# Patient Record
Sex: Female | Born: 1995 | Race: White | Hispanic: No | Marital: Single | Smoking: Never smoker
Health system: Southern US, Community
[De-identification: ages and names within clinical notes are randomized; demographics above are authoritative.]

## PROBLEM LIST (undated history)

## (undated) DIAGNOSIS — K859 Acute pancreatitis without necrosis or infection, unspecified: Secondary | ICD-10-CM

## (undated) DIAGNOSIS — N200 Calculus of kidney: Secondary | ICD-10-CM

## (undated) DIAGNOSIS — I951 Orthostatic hypotension: Secondary | ICD-10-CM

## (undated) DIAGNOSIS — N289 Disorder of kidney and ureter, unspecified: Secondary | ICD-10-CM

## (undated) HISTORY — PX: LAPAROSCOPIC GASTRIC SLEEVE RESECTION: SHX5895

## (undated) HISTORY — PX: HERNIA REPAIR: SHX51

## (undated) HISTORY — PX: OTHER SURGICAL HISTORY: SHX169

## (undated) HISTORY — PX: BREAST SURGERY: SHX581

---

## 2018-09-04 ENCOUNTER — Encounter (HOSPITAL_COMMUNITY): Payer: Self-pay | Admitting: *Deleted

## 2018-09-04 ENCOUNTER — Other Ambulatory Visit: Payer: Self-pay

## 2018-09-04 ENCOUNTER — Emergency Department (HOSPITAL_COMMUNITY): Payer: Self-pay

## 2018-09-04 ENCOUNTER — Emergency Department (HOSPITAL_COMMUNITY)
Admission: EM | Admit: 2018-09-04 | Discharge: 2018-09-04 | Disposition: A | Discharge status: Home / Self Care | Payer: Self-pay | Attending: Emergency Medicine | Admitting: Emergency Medicine

## 2018-09-04 DIAGNOSIS — Y929 Unspecified place or not applicable: Secondary | ICD-10-CM | POA: Insufficient documentation

## 2018-09-04 DIAGNOSIS — S39012A Strain of muscle, fascia and tendon of lower back, initial encounter: Secondary | ICD-10-CM | POA: Insufficient documentation

## 2018-09-04 DIAGNOSIS — Y9352 Activity, horseback riding: Secondary | ICD-10-CM | POA: Insufficient documentation

## 2018-09-04 DIAGNOSIS — W19XXXA Unspecified fall, initial encounter: Secondary | ICD-10-CM

## 2018-09-04 DIAGNOSIS — Y999 Unspecified external cause status: Secondary | ICD-10-CM | POA: Insufficient documentation

## 2018-09-04 DIAGNOSIS — S7001XA Contusion of right hip, initial encounter: Secondary | ICD-10-CM | POA: Insufficient documentation

## 2018-09-04 DIAGNOSIS — Z79899 Other long term (current) drug therapy: Secondary | ICD-10-CM | POA: Insufficient documentation

## 2018-09-04 HISTORY — DX: Orthostatic hypotension: I95.1

## 2018-09-04 HISTORY — DX: Disorder of kidney and ureter, unspecified: N28.9

## 2018-09-04 HISTORY — DX: Acute pancreatitis without necrosis or infection, unspecified: K85.90

## 2018-09-04 HISTORY — DX: Calculus of kidney: N20.0

## 2018-09-04 LAB — I-STAT BETA HCG BLOOD, ED (MC, WL, AP ONLY): I-stat hCG, quantitative: <5 m[IU]/mL (ref <5)

## 2018-09-04 MED ORDER — KETOROLAC TROMETHAMINE 15 MG/ML IJ SOLN
15.0000 mg | Freq: Once | INTRAMUSCULAR | Status: AC
  Administered 2018-09-04: 15 mg via INTRAVENOUS
  Filled 2018-09-04: qty 1

## 2018-09-04 MED ORDER — NAPROXEN 375 MG PO TABS: 375.0000 mg | ORAL_TABLET | Freq: Two times a day (BID) | ORAL | 0 refills | Status: DC

## 2018-09-04 MED ORDER — HYDROCODONE-ACETAMINOPHEN 5-325 MG PO TABS: 1.0000 | ORAL_TABLET | Freq: Four times a day (QID) | ORAL | 0 refills | Status: DC | PRN

## 2018-09-04 MED ORDER — ONDANSETRON HCL 4 MG/2ML IJ SOLN
4.0000 mg | Freq: Once | INTRAMUSCULAR | Status: AC
  Administered 2018-09-04: 4 mg via INTRAVENOUS
  Filled 2018-09-04: qty 2

## 2018-09-04 MED ORDER — HYDROMORPHONE HCL 1 MG/ML IJ SOLN
1.0000 mg | Freq: Once | INTRAMUSCULAR | Status: AC
  Administered 2018-09-04: 1 mg via INTRAVENOUS
  Filled 2018-09-04: qty 1

## 2018-09-04 MED ORDER — PROMETHAZINE HCL 25 MG/ML IJ SOLN
25.0000 mg | Freq: Once | INTRAMUSCULAR | Status: AC
  Administered 2018-09-04: 25 mg via INTRAVENOUS
  Filled 2018-09-04: qty 1

## 2018-09-04 MED ORDER — ONDANSETRON 4 MG PO TBDP: 4.0000 mg | ORAL_TABLET | Freq: Three times a day (TID) | ORAL | 0 refills | Status: DC | PRN

## 2018-09-04 NOTE — ED Notes (Signed)
Pt able to stand without assistance.

## 2018-09-04 NOTE — ED Provider Notes (Signed)
[facility]-EMERGENCY DEPT Provider Note   CSN: [id number] Arrival date & time: 09/04/18  1751     History   Chief Complaint Chief Complaint  Patient presents with  . Fall    HPI Camaria Gerald is a 22 y.o. female.  HPI  22 year old female presents after being thrown off a horse.  She was not wearing a helmet.  She states the horse bucked and she fell but is not sure exactly how she fell.  She was able to get up and ambulate back to the house.  However after she rested she has been unable to get up and bear weight due to the pain.  Pain is mostly in her lumbar back and right hip/buttock.  Pain is severe.  She was given 200 mcg fentanyl by EMS.  Now she is feeling a little nauseated.  She denies LOC, headache, chest pain, shortness of breath or thoracic back pain.  No abdominal pain.  No weakness or numbness in her extremities.  Past Medical History:  Diagnosis Date  . Hypotension, postural   . Kidney stones   . Pancreatitis   . Renal disorder    acute kidney failure    There are no active problems to display for this patient.   Past Surgical History:  Procedure Laterality Date  . BREAST SURGERY     Breast lift  . HERNIA REPAIR     hiatal hernia  . Tummy Tuck       OB History   None      Home Medications    Prior to Admission medications   Medication Sig Start Date End Date Taking? Authorizing Provider  esomeprazole (NEXIUM) 40 MG capsule Take 40 mg by mouth 2 (two) times daily before a meal.   Yes [provider]  medroxyPROGESTERone (DEPO-PROVERA) 150 MG/ML injection Inject 150 mg into the muscle every 3 (three) months.   Yes [provider]  temazepam (RESTORIL) 7.5 MG capsule Take 7.5 mg by mouth at bedtime as needed for sleep.   Yes [provider]  HYDROcodone-acetaminophen (NORCO) 5-325 MG tablet Take 1 tablet by mouth every 6 (six) hours as needed for severe pain. 09/04/18   Pricilla Loveless, MD  naproxen  (NAPROSYN) 375 MG tablet Take 1 tablet (375 mg total) by mouth 2 (two) times daily. 09/04/18   Pricilla Loveless, MD  ondansetron (ZOFRAN ODT) 4 MG disintegrating tablet Take 1 tablet (4 mg total) by mouth every 8 (eight) hours as needed for nausea or vomiting. 09/04/18   Pricilla Loveless, MD    Family History No family history on file.  Social History Social History   Tobacco Use  . Smoking status: Never Smoker  . Smokeless tobacco: Never Used  Substance Use Topics  . Alcohol use: Yes    Comment: socially  . Drug use: Never     Allergies   Ciprofloxacin   Review of Systems Review of Systems  Cardiovascular: Negative for chest pain.  Gastrointestinal: Negative for abdominal pain.  Musculoskeletal: Positive for back pain.  Neurological: Negative for weakness, numbness and headaches.  All other systems reviewed and are negative.    Physical Exam Updated Vital Signs BP 96/66   Pulse 72   Temp 98.7 F (37.1 C) (Oral)   Resp 16   Ht 5\' 5"  (1.651 m)   Wt 65.8 kg   SpO2 100%   BMI 24.13 kg/m   Physical Exam  Constitutional: She is oriented to person, place, and time. She  appears well-developed and well-nourished.  HENT:  Head: Normocephalic and atraumatic.  Right Ear: External ear normal.  Left Ear: External ear normal.  Nose: Nose normal.  Eyes: Right eye exhibits no discharge. Left eye exhibits no discharge.  Cardiovascular: Normal rate, regular rhythm and normal heart sounds.  Pulses:      Dorsalis pedis pulses are 2+ on the right side, and 2+ on the left side.  Pulmonary/Chest: Effort normal and breath sounds normal.  Abdominal: Soft. She exhibits no distension. There is no tenderness.  Musculoskeletal:       Right hip: She exhibits tenderness. She exhibits normal range of motion.       Thoracic back: She exhibits no tenderness.       Lumbar back: She exhibits tenderness.       Back:       Legs: Neurological: She is alert and oriented to person, place, and  time.  Skin: Skin is warm and dry.  Nursing note and vitals reviewed.    ED Treatments / Results  Labs (all labs ordered are listed, but only abnormal results are displayed) Labs Reviewed  I-STAT BETA HCG BLOOD, ED ([facility], [facility], [facility] ONLY)    EKG None  Radiology Dg Lumbar Spine Complete  Result Date: 09/04/2018 CLINICAL DATA:  Fall with back pain EXAM: LUMBAR SPINE - COMPLETE 4+ VIEW COMPARISON:  None. FINDINGS: There is no evidence of lumbar spine fracture. Alignment is normal. Intervertebral disc spaces are maintained. IMPRESSION: Negative. Electronically Signed   By: Jasmine Pang M.D.   On: 09/04/2018 19:30   Dg Hip Unilat  With Pelvis 2-3 Views Right  Result Date: 09/04/2018 CLINICAL DATA:  Thrown from horse with pain in the right hip pelvis and back EXAM: DG HIP (WITH OR WITHOUT PELVIS) 2-3V RIGHT COMPARISON:  None. FINDINGS: There is no evidence of hip fracture or dislocation. There is no evidence of arthropathy or other focal bone abnormality. IMPRESSION: Negative. Electronically Signed   By: Jasmine Pang M.D.   On: 09/04/2018 19:29    Procedures Procedures (including critical care time)  Medications Ordered in ED Medications  HYDROmorphone (DILAUDID) injection 1 mg (1 mg Intravenous Given 09/04/18 1825)  ondansetron (ZOFRAN) injection 4 mg (4 mg Intravenous Given 09/04/18 1824)  ketorolac (TORADOL) 15 MG/ML injection 15 mg (15 mg Intravenous Given 09/04/18 2023)  promethazine (PHENERGAN) injection 25 mg (25 mg Intravenous Given 09/04/18 2030)     Initial Impression / Assessment and Plan / ED Course  I have reviewed the triage vital signs and the nursing notes.  Pertinent labs & imaging results that were available during my care of the patient were reviewed by me and considered in my medical decision making (see chart for details).     No fractures identified on x-ray imaging from fall.  Neurovascularly intact. She is able to ambulate and bear weight.  While it is painful, she  can bear weight and so I highly doubt occult fracture.  Her pain will be treated and she will be given follow-up with her PCP.  However at this point there does not appear to be an acute indication for admission or further imaging.  Discharged home with return precautions.  Final Clinical Impressions(s) / ED Diagnoses   Final diagnoses:  Fall, initial encounter  Contusion of right hip, initial encounter  Strain of lumbar region, initial encounter    ED Discharge Orders         Ordered    HYDROcodone-acetaminophen (NORCO) 5-325 MG tablet  Every 6 hours PRN,   Status:  Discontinued     09/04/18 2042    naproxen (NAPROSYN) 375 MG tablet  2 times daily,   Status:  Discontinued     09/04/18 2042    naproxen (NAPROSYN) 375 MG tablet  2 times daily     09/04/18 2044    HYDROcodone-acetaminophen (NORCO) 5-325 MG tablet  Every 6 hours PRN     09/04/18 2044    ondansetron (ZOFRAN ODT) 4 MG disintegrating tablet  Every 8 hours PRN     09/04/18 2118           Pricilla Loveless, MD 09/04/18 2225

## 2018-09-04 NOTE — ED Triage Notes (Signed)
Per EMS, pt from home, reports pt was thrown off a horse landing on her back. Did not hit her head.  Pt is A&Ox 4.  She received of Fentanyl en route.

## 2018-09-04 NOTE — ED Notes (Signed)
Abrasions and bruising noted to her bila posterior FA, R medial thigh and LLE.  She denies any abd pain or neck pain.  She reports pressure pain in her sacral area and R buttocks.

## 2018-09-04 NOTE — Discharge Instructions (Addendum)
If you are unable to bear weight on your leg or walk, or if you develop weakness, numbness, or any other new/concerning symptoms and return to the ER for evaluation. Do not take ibuprofen, Aleve, naproxen, Motrin, Advil, or any other NSAIDs while you are on the naproxen.

## 2018-09-04 NOTE — ED Notes (Signed)
Bed: VZ56 Expected date:  Expected time:  Means of arrival:  Comments: EMS thrown off a horse

## 2018-10-21 IMAGING — CR DG LUMBAR SPINE COMPLETE 4+V
5 series · 5 of 5 positions shown · non-contrast
Comparison: None.

CLINICAL DATA: Fall with back pain

EXAM:
LUMBAR SPINE - COMPLETE 4+ VIEW

[t lumbar spine ap]
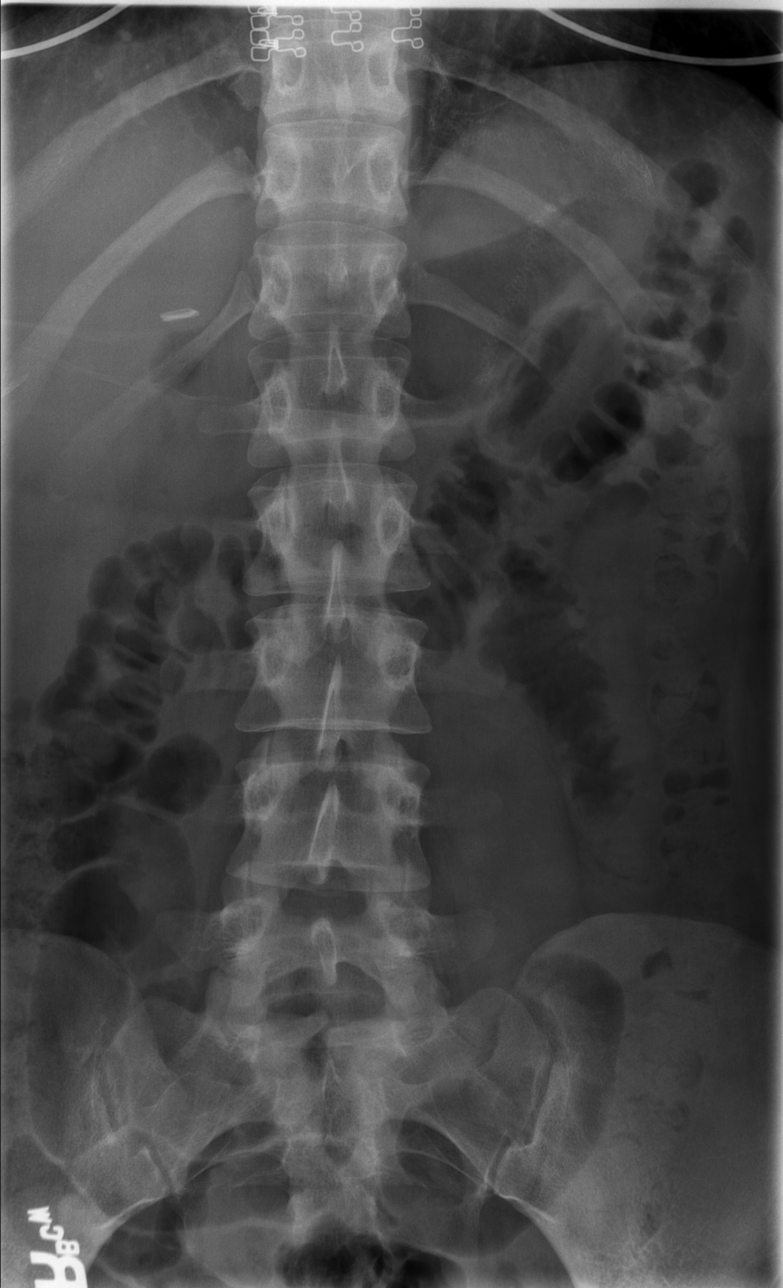

[t lumbar spine obl (1 of 2)]
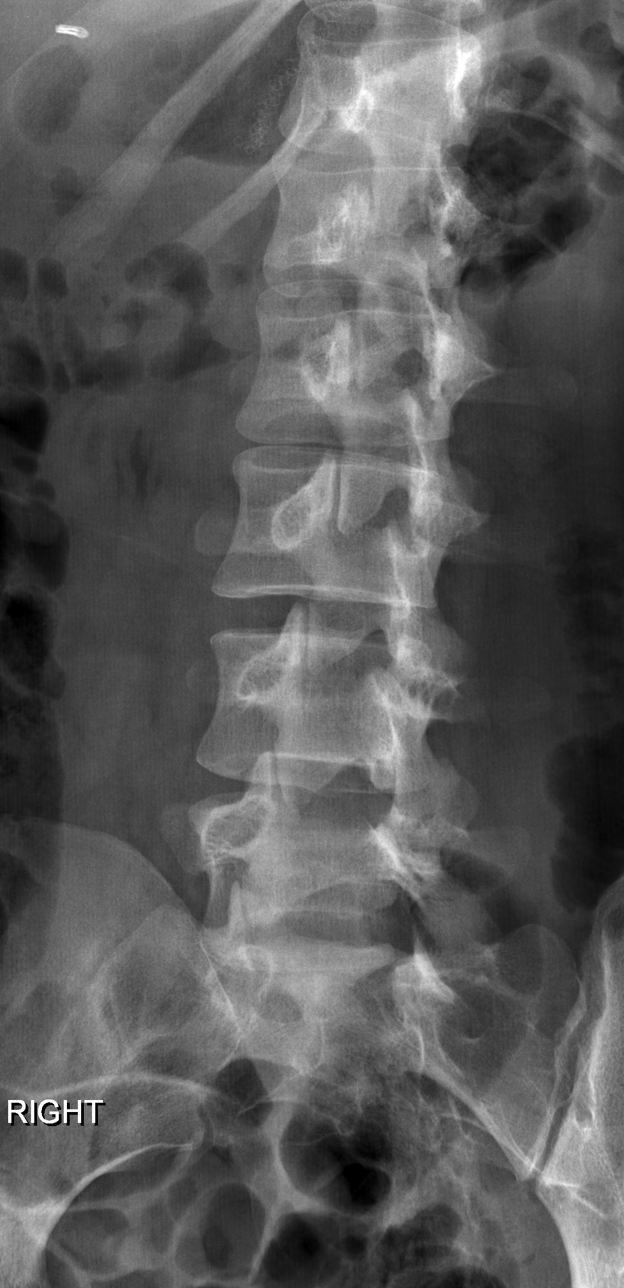

[t lumbar spine obl (2 of 2)]
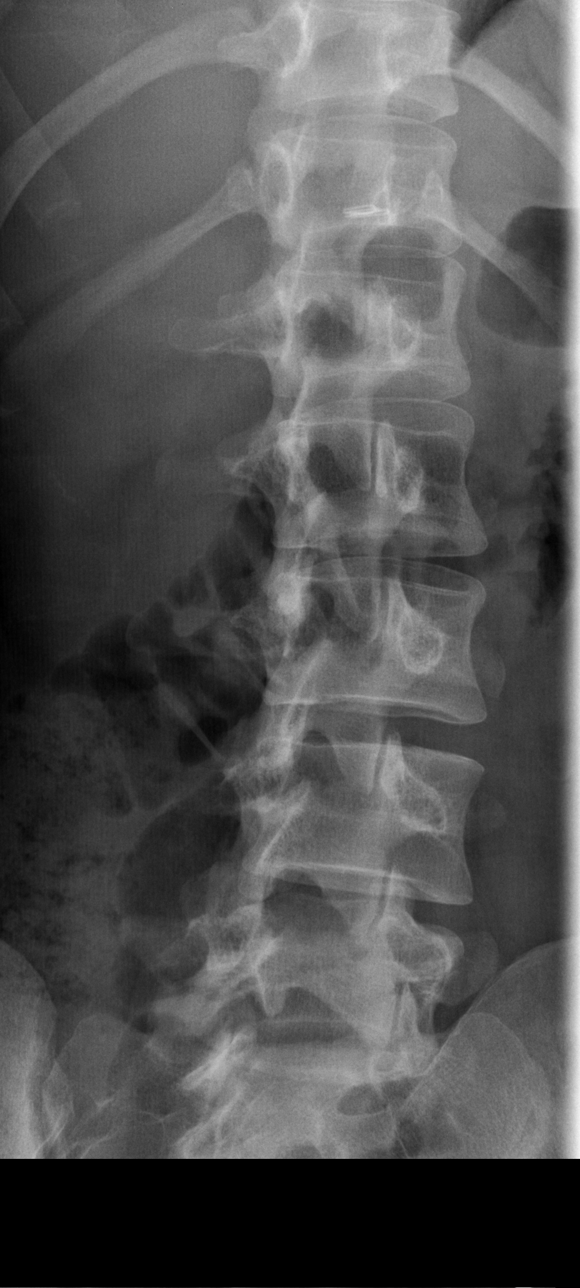

[t lumbar spine lat]
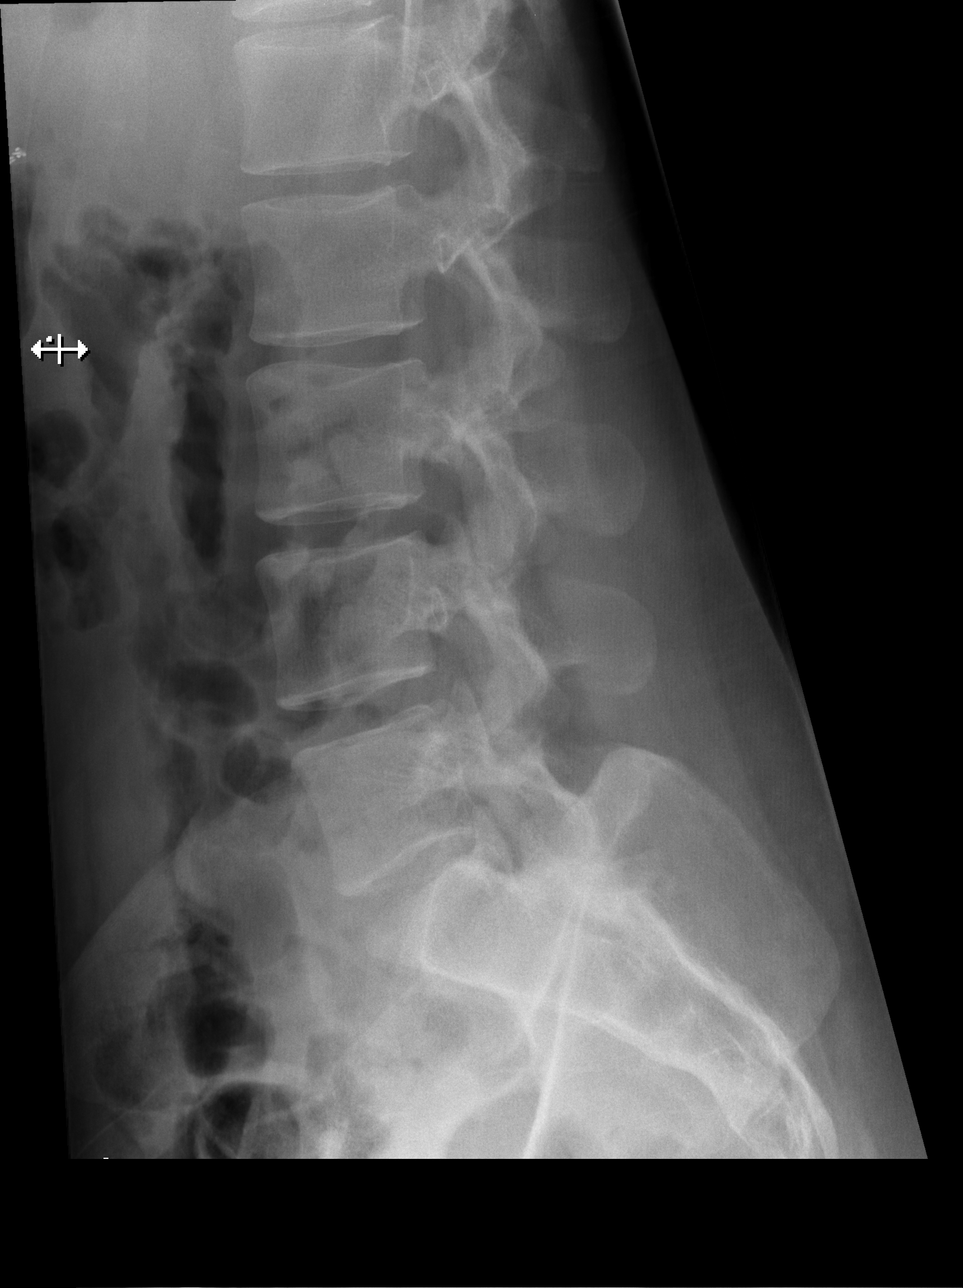

[t lumbar l-5 s-1 spot]
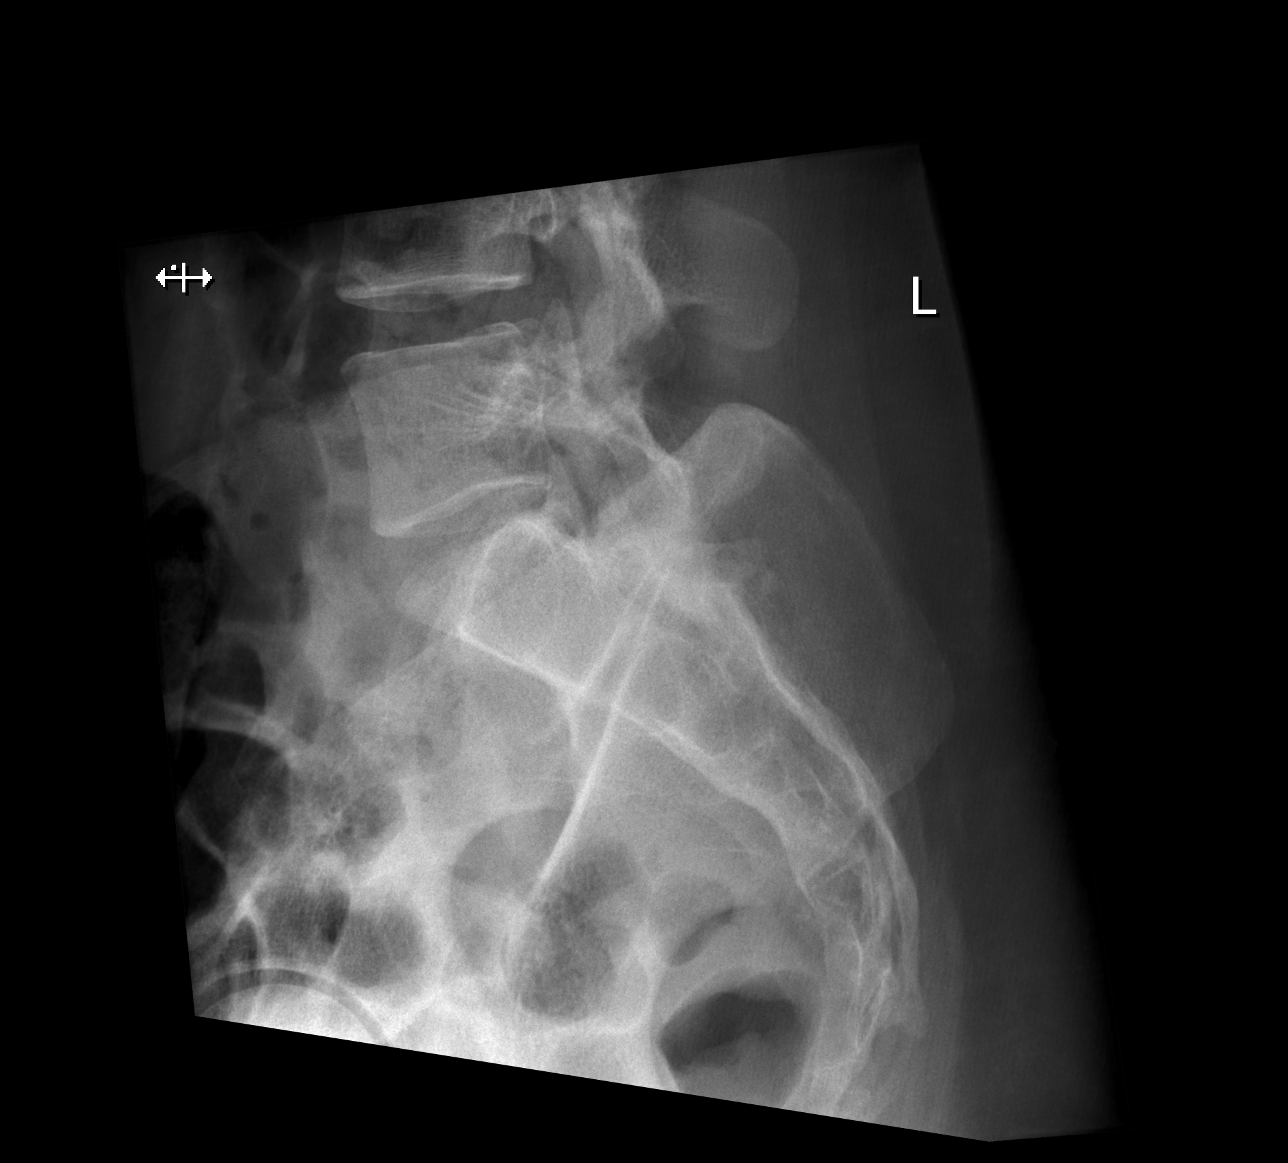

[5 of 5 positions shown; findings below may reference images not displayed]

FINDINGS: There is no evidence of lumbar spine fracture. Alignment is normal.
Intervertebral disc spaces are maintained.
IMPRESSION: Negative.

## 2021-04-09 DIAGNOSIS — F3181 Bipolar II disorder: Secondary | ICD-10-CM | POA: Diagnosis not present

## 2021-05-08 DIAGNOSIS — F4312 Post-traumatic stress disorder, chronic: Secondary | ICD-10-CM | POA: Diagnosis not present

## 2021-06-14 DIAGNOSIS — F3181 Bipolar II disorder: Secondary | ICD-10-CM | POA: Diagnosis not present

## 2021-08-24 DIAGNOSIS — F3181 Bipolar II disorder: Secondary | ICD-10-CM | POA: Diagnosis not present

## 2021-09-07 DIAGNOSIS — F3181 Bipolar II disorder: Secondary | ICD-10-CM | POA: Diagnosis not present

## 2021-09-11 DIAGNOSIS — F4312 Post-traumatic stress disorder, chronic: Secondary | ICD-10-CM | POA: Diagnosis not present

## 2021-09-11 DIAGNOSIS — F3181 Bipolar II disorder: Secondary | ICD-10-CM | POA: Diagnosis not present

## 2021-10-11 DIAGNOSIS — F4312 Post-traumatic stress disorder, chronic: Secondary | ICD-10-CM | POA: Diagnosis not present

## 2021-12-21 DIAGNOSIS — U071 COVID-19: Secondary | ICD-10-CM | POA: Diagnosis not present

## 2021-12-21 DIAGNOSIS — R051 Acute cough: Secondary | ICD-10-CM | POA: Diagnosis not present

## 2022-01-09 DIAGNOSIS — F4312 Post-traumatic stress disorder, chronic: Secondary | ICD-10-CM | POA: Diagnosis not present

## 2022-04-09 DIAGNOSIS — F4312 Post-traumatic stress disorder, chronic: Secondary | ICD-10-CM | POA: Diagnosis not present

## 2022-07-08 DIAGNOSIS — S161XXA Strain of muscle, fascia and tendon at neck level, initial encounter: Secondary | ICD-10-CM | POA: Diagnosis not present

## 2022-07-08 DIAGNOSIS — S39012A Strain of muscle, fascia and tendon of lower back, initial encounter: Secondary | ICD-10-CM | POA: Diagnosis not present

## 2022-07-08 DIAGNOSIS — F4312 Post-traumatic stress disorder, chronic: Secondary | ICD-10-CM | POA: Diagnosis not present

## 2022-08-07 DIAGNOSIS — M79602 Pain in left arm: Secondary | ICD-10-CM | POA: Diagnosis not present

## 2022-08-07 DIAGNOSIS — F32A Depression, unspecified: Secondary | ICD-10-CM | POA: Diagnosis not present

## 2022-08-07 DIAGNOSIS — M542 Cervicalgia: Secondary | ICD-10-CM | POA: Diagnosis not present

## 2022-08-07 DIAGNOSIS — H6092 Unspecified otitis externa, left ear: Secondary | ICD-10-CM | POA: Diagnosis not present

## 2022-08-07 DIAGNOSIS — F419 Anxiety disorder, unspecified: Secondary | ICD-10-CM | POA: Diagnosis not present

## 2022-08-07 DIAGNOSIS — M25512 Pain in left shoulder: Secondary | ICD-10-CM | POA: Diagnosis not present

## 2022-08-07 DIAGNOSIS — G5692 Unspecified mononeuropathy of left upper limb: Secondary | ICD-10-CM | POA: Diagnosis not present

## 2022-08-07 DIAGNOSIS — R519 Headache, unspecified: Secondary | ICD-10-CM | POA: Diagnosis not present

## 2022-08-07 DIAGNOSIS — H9202 Otalgia, left ear: Secondary | ICD-10-CM | POA: Diagnosis not present

## 2022-08-07 DIAGNOSIS — Z79899 Other long term (current) drug therapy: Secondary | ICD-10-CM | POA: Diagnosis not present

## 2022-08-09 DIAGNOSIS — M47892 Other spondylosis, cervical region: Secondary | ICD-10-CM | POA: Diagnosis not present

## 2022-08-09 DIAGNOSIS — M5412 Radiculopathy, cervical region: Secondary | ICD-10-CM | POA: Diagnosis not present

## 2022-08-09 DIAGNOSIS — M542 Cervicalgia: Secondary | ICD-10-CM | POA: Diagnosis not present

## 2022-08-12 DIAGNOSIS — M5412 Radiculopathy, cervical region: Secondary | ICD-10-CM | POA: Diagnosis not present

## 2022-08-12 DIAGNOSIS — M47892 Other spondylosis, cervical region: Secondary | ICD-10-CM | POA: Diagnosis not present

## 2022-08-13 DIAGNOSIS — Z6829 Body mass index (BMI) 29.0-29.9, adult: Secondary | ICD-10-CM | POA: Diagnosis not present

## 2022-08-13 DIAGNOSIS — K449 Diaphragmatic hernia without obstruction or gangrene: Secondary | ICD-10-CM | POA: Diagnosis not present

## 2022-09-12 DIAGNOSIS — M542 Cervicalgia: Secondary | ICD-10-CM | POA: Diagnosis not present

## 2022-09-12 DIAGNOSIS — M47892 Other spondylosis, cervical region: Secondary | ICD-10-CM | POA: Diagnosis not present

## 2022-09-12 DIAGNOSIS — Q046 Congenital cerebral cysts: Secondary | ICD-10-CM | POA: Diagnosis not present

## 2022-09-12 DIAGNOSIS — M5412 Radiculopathy, cervical region: Secondary | ICD-10-CM | POA: Diagnosis not present

## 2022-10-02 DIAGNOSIS — K449 Diaphragmatic hernia without obstruction or gangrene: Secondary | ICD-10-CM | POA: Diagnosis not present

## 2022-10-02 DIAGNOSIS — K219 Gastro-esophageal reflux disease without esophagitis: Secondary | ICD-10-CM | POA: Diagnosis not present

## 2022-10-02 DIAGNOSIS — R109 Unspecified abdominal pain: Secondary | ICD-10-CM | POA: Diagnosis not present

## 2022-10-08 DIAGNOSIS — F4312 Post-traumatic stress disorder, chronic: Secondary | ICD-10-CM | POA: Diagnosis not present

## 2022-10-09 DIAGNOSIS — N281 Cyst of kidney, acquired: Secondary | ICD-10-CM | POA: Diagnosis not present

## 2022-10-09 DIAGNOSIS — K449 Diaphragmatic hernia without obstruction or gangrene: Secondary | ICD-10-CM | POA: Diagnosis not present

## 2022-10-09 DIAGNOSIS — R112 Nausea with vomiting, unspecified: Secondary | ICD-10-CM | POA: Diagnosis not present

## 2022-10-15 DIAGNOSIS — Z6829 Body mass index (BMI) 29.0-29.9, adult: Secondary | ICD-10-CM | POA: Diagnosis not present

## 2022-10-15 DIAGNOSIS — Z9884 Bariatric surgery status: Secondary | ICD-10-CM | POA: Diagnosis not present

## 2022-10-15 DIAGNOSIS — K449 Diaphragmatic hernia without obstruction or gangrene: Secondary | ICD-10-CM | POA: Diagnosis not present

## 2022-10-15 DIAGNOSIS — Z48815 Encounter for surgical aftercare following surgery on the digestive system: Secondary | ICD-10-CM | POA: Diagnosis not present

## 2022-10-29 DIAGNOSIS — Q046 Congenital cerebral cysts: Secondary | ICD-10-CM | POA: Diagnosis not present

## 2022-10-29 DIAGNOSIS — M47892 Other spondylosis, cervical region: Secondary | ICD-10-CM | POA: Diagnosis not present

## 2022-10-29 DIAGNOSIS — M5412 Radiculopathy, cervical region: Secondary | ICD-10-CM | POA: Diagnosis not present

## 2022-10-29 DIAGNOSIS — M542 Cervicalgia: Secondary | ICD-10-CM | POA: Diagnosis not present

## 2022-11-11 DIAGNOSIS — R112 Nausea with vomiting, unspecified: Secondary | ICD-10-CM | POA: Diagnosis not present

## 2022-11-11 DIAGNOSIS — Z87442 Personal history of urinary calculi: Secondary | ICD-10-CM | POA: Diagnosis not present

## 2022-11-11 DIAGNOSIS — F319 Bipolar disorder, unspecified: Secondary | ICD-10-CM | POA: Diagnosis not present

## 2022-11-11 DIAGNOSIS — K219 Gastro-esophageal reflux disease without esophagitis: Secondary | ICD-10-CM | POA: Diagnosis not present

## 2022-11-11 DIAGNOSIS — K660 Peritoneal adhesions (postprocedural) (postinfection): Secondary | ICD-10-CM | POA: Diagnosis not present

## 2022-11-11 DIAGNOSIS — F17290 Nicotine dependence, other tobacco product, uncomplicated: Secondary | ICD-10-CM | POA: Diagnosis not present

## 2022-11-11 DIAGNOSIS — Z903 Acquired absence of stomach [part of]: Secondary | ICD-10-CM | POA: Diagnosis not present

## 2022-11-11 DIAGNOSIS — K449 Diaphragmatic hernia without obstruction or gangrene: Secondary | ICD-10-CM | POA: Diagnosis not present

## 2022-11-11 DIAGNOSIS — Z9884 Bariatric surgery status: Secondary | ICD-10-CM | POA: Diagnosis not present

## 2022-11-11 DIAGNOSIS — F419 Anxiety disorder, unspecified: Secondary | ICD-10-CM | POA: Diagnosis not present

## 2022-11-11 DIAGNOSIS — Z9049 Acquired absence of other specified parts of digestive tract: Secondary | ICD-10-CM | POA: Diagnosis not present

## 2022-11-12 DIAGNOSIS — Z9049 Acquired absence of other specified parts of digestive tract: Secondary | ICD-10-CM | POA: Diagnosis not present

## 2022-11-12 DIAGNOSIS — K660 Peritoneal adhesions (postprocedural) (postinfection): Secondary | ICD-10-CM | POA: Diagnosis not present

## 2022-11-12 DIAGNOSIS — Z9884 Bariatric surgery status: Secondary | ICD-10-CM | POA: Diagnosis not present

## 2022-11-12 DIAGNOSIS — F17290 Nicotine dependence, other tobacco product, uncomplicated: Secondary | ICD-10-CM | POA: Diagnosis not present

## 2022-11-12 DIAGNOSIS — Z903 Acquired absence of stomach [part of]: Secondary | ICD-10-CM | POA: Diagnosis not present

## 2022-11-12 DIAGNOSIS — F319 Bipolar disorder, unspecified: Secondary | ICD-10-CM | POA: Diagnosis not present

## 2022-11-12 DIAGNOSIS — K219 Gastro-esophageal reflux disease without esophagitis: Secondary | ICD-10-CM | POA: Diagnosis not present

## 2022-11-12 DIAGNOSIS — R112 Nausea with vomiting, unspecified: Secondary | ICD-10-CM | POA: Diagnosis not present

## 2022-11-12 DIAGNOSIS — K449 Diaphragmatic hernia without obstruction or gangrene: Secondary | ICD-10-CM | POA: Diagnosis not present

## 2022-11-12 DIAGNOSIS — Z87442 Personal history of urinary calculi: Secondary | ICD-10-CM | POA: Diagnosis not present

## 2022-11-12 DIAGNOSIS — F419 Anxiety disorder, unspecified: Secondary | ICD-10-CM | POA: Diagnosis not present

## 2022-11-25 DIAGNOSIS — F4312 Post-traumatic stress disorder, chronic: Secondary | ICD-10-CM | POA: Diagnosis not present

## 2022-11-27 DIAGNOSIS — R231 Pallor: Secondary | ICD-10-CM | POA: Diagnosis not present

## 2022-11-27 DIAGNOSIS — R079 Chest pain, unspecified: Secondary | ICD-10-CM | POA: Diagnosis not present

## 2022-11-27 DIAGNOSIS — N83291 Other ovarian cyst, right side: Secondary | ICD-10-CM | POA: Diagnosis not present

## 2022-11-27 DIAGNOSIS — R001 Bradycardia, unspecified: Secondary | ICD-10-CM | POA: Diagnosis not present

## 2022-11-27 DIAGNOSIS — R002 Palpitations: Secondary | ICD-10-CM | POA: Diagnosis not present

## 2022-11-27 DIAGNOSIS — R0789 Other chest pain: Secondary | ICD-10-CM | POA: Diagnosis not present

## 2022-11-27 DIAGNOSIS — Z8719 Personal history of other diseases of the digestive system: Secondary | ICD-10-CM | POA: Diagnosis not present

## 2022-11-27 DIAGNOSIS — R112 Nausea with vomiting, unspecified: Secondary | ICD-10-CM | POA: Diagnosis not present

## 2022-11-27 DIAGNOSIS — R1013 Epigastric pain: Secondary | ICD-10-CM | POA: Diagnosis not present

## 2022-11-27 DIAGNOSIS — Z9889 Other specified postprocedural states: Secondary | ICD-10-CM | POA: Diagnosis not present

## 2022-11-27 DIAGNOSIS — R531 Weakness: Secondary | ICD-10-CM | POA: Diagnosis not present

## 2022-11-28 DIAGNOSIS — R531 Weakness: Secondary | ICD-10-CM | POA: Diagnosis not present

## 2022-11-28 DIAGNOSIS — R112 Nausea with vomiting, unspecified: Secondary | ICD-10-CM | POA: Diagnosis not present

## 2022-11-28 DIAGNOSIS — R0789 Other chest pain: Secondary | ICD-10-CM | POA: Diagnosis not present

## 2022-11-28 DIAGNOSIS — I498 Other specified cardiac arrhythmias: Secondary | ICD-10-CM | POA: Diagnosis not present

## 2022-11-28 DIAGNOSIS — R1013 Epigastric pain: Secondary | ICD-10-CM | POA: Diagnosis not present

## 2022-12-03 ENCOUNTER — Other Ambulatory Visit: Payer: Self-pay

## 2022-12-03 ENCOUNTER — Emergency Department: Payer: BC Managed Care – PPO

## 2022-12-03 ENCOUNTER — Emergency Department
Admission: EM | Admit: 2022-12-03 | Discharge: 2022-12-03 | Disposition: A | Discharge status: Home / Self Care | Payer: BC Managed Care – PPO | Attending: Emergency Medicine | Admitting: Emergency Medicine

## 2022-12-03 ENCOUNTER — Encounter: Payer: Self-pay | Admitting: Emergency Medicine

## 2022-12-03 DIAGNOSIS — R001 Bradycardia, unspecified: Secondary | ICD-10-CM | POA: Diagnosis not present

## 2022-12-03 DIAGNOSIS — R7989 Other specified abnormal findings of blood chemistry: Secondary | ICD-10-CM | POA: Diagnosis not present

## 2022-12-03 DIAGNOSIS — R1115 Cyclical vomiting syndrome unrelated to migraine: Secondary | ICD-10-CM

## 2022-12-03 DIAGNOSIS — G43A0 Cyclical vomiting, not intractable: Secondary | ICD-10-CM | POA: Insufficient documentation

## 2022-12-03 DIAGNOSIS — R112 Nausea with vomiting, unspecified: Secondary | ICD-10-CM | POA: Diagnosis not present

## 2022-12-03 DIAGNOSIS — N83201 Unspecified ovarian cyst, right side: Secondary | ICD-10-CM | POA: Diagnosis not present

## 2022-12-03 DIAGNOSIS — R109 Unspecified abdominal pain: Secondary | ICD-10-CM | POA: Diagnosis not present

## 2022-12-03 DIAGNOSIS — R509 Fever, unspecified: Secondary | ICD-10-CM | POA: Diagnosis not present

## 2022-12-03 LAB — COMPREHENSIVE METABOLIC PANEL
ALT: 8 U/L (ref 0–44)
AST: 11 U/L — ABNORMAL LOW (ref 15–41)
Albumin: 4.3 g/dL (ref 3.5–5.0)
Alkaline Phosphatase: 51 U/L (ref 38–126)
Anion gap: 12 (ref 5–15)
BUN: 6 mg/dL (ref 6–20)
CO2: 20 mmol/L — ABNORMAL LOW (ref 22–32)
Calcium: 9.2 mg/dL (ref 8.9–10.3)
Chloride: 103 mmol/L (ref 98–111)
Creatinine, Ser: 0.64 mg/dL (ref 0.44–1.00)
GFR, Estimated: >60 mL/min (ref >60)
Glucose, Bld: 84 mg/dL (ref 70–99)
Potassium: 3.7 mmol/L (ref 3.5–5.1)
Sodium: 135 mmol/L (ref 135–145)
Total Bilirubin: 1.6 mg/dL — ABNORMAL HIGH (ref 0.3–1.2)
Total Protein: 7.1 g/dL (ref 6.5–8.1)

## 2022-12-03 LAB — URINALYSIS, ROUTINE W REFLEX MICROSCOPIC
Bilirubin Urine: NEGATIVE
Glucose, UA: NEGATIVE mg/dL
Hgb urine dipstick: NEGATIVE
Ketones, ur: 80 mg/dL — AB
Leukocytes,Ua: NEGATIVE
Nitrite: NEGATIVE
Protein, ur: NEGATIVE mg/dL
Specific Gravity, Urine: 1.01 (ref 1.005–1.030)
pH: 6 (ref 5.0–8.0)

## 2022-12-03 LAB — D-DIMER, QUANTITATIVE: D-Dimer, Quant: 0.58 ug/mL-FEU — ABNORMAL HIGH (ref 0.00–0.50)

## 2022-12-03 LAB — CBC
HCT: 42.9 % (ref 36.0–46.0)
Hemoglobin: 14.7 g/dL (ref 12.0–15.0)
MCH: 31 pg (ref 26.0–34.0)
MCHC: 34.3 g/dL (ref 30.0–36.0)
MCV: 90.5 fL (ref 80.0–100.0)
Platelets: 300 10*3/uL (ref 150–400)
RBC: 4.74 MIL/uL (ref 3.87–5.11)
RDW: 12.4 % (ref 11.5–15.5)
WBC: 8 10*3/uL (ref 4.0–10.5)
nRBC: 0 % (ref 0.0–0.2)

## 2022-12-03 LAB — HCG, QUANTITATIVE, PREGNANCY: hCG, Beta Chain, Quant, S: <1 m[IU]/mL (ref <5)

## 2022-12-03 LAB — LIPASE, BLOOD: Lipase: 65 U/L — ABNORMAL HIGH (ref 11–51)

## 2022-12-03 MED ORDER — ONDANSETRON 4 MG PO TBDP: 4.0000 mg | ORAL_TABLET | Freq: Three times a day (TID) | ORAL | 0 refills | Status: DC | PRN

## 2022-12-03 MED ORDER — METOCLOPRAMIDE HCL 10 MG PO TABS: 10.0000 mg | ORAL_TABLET | Freq: Three times a day (TID) | ORAL | 0 refills | Status: DC | PRN

## 2022-12-03 MED ORDER — IOHEXOL 350 MG/ML SOLN
75.0000 mL | Freq: Once | INTRAVENOUS | Status: AC | PRN
  Administered 2022-12-03: 75 mL via INTRAVENOUS

## 2022-12-03 MED ORDER — ONDANSETRON HCL 4 MG/2ML IJ SOLN
4.0000 mg | Freq: Once | INTRAMUSCULAR | Status: AC
  Administered 2022-12-03: 4 mg via INTRAVENOUS
  Filled 2022-12-03: qty 2

## 2022-12-03 MED ORDER — DROPERIDOL 2.5 MG/ML IJ SOLN
2.5000 mg | Freq: Once | INTRAMUSCULAR | Status: AC
  Administered 2022-12-03: 2.5 mg via INTRAVENOUS
  Filled 2022-12-03: qty 2

## 2022-12-03 MED ORDER — LACTATED RINGERS IV BOLUS
1000.0000 mL | Freq: Once | INTRAVENOUS | Status: AC
  Administered 2022-12-03: 1000 mL via INTRAVENOUS

## 2022-12-03 MED ORDER — DROPERIDOL 2.5 MG/ML IJ SOLN: 2.5000 mg | Freq: Once | INTRAMUSCULAR | Status: DC

## 2022-12-03 NOTE — ED Provider Triage Note (Signed)
Emergency Medicine Provider Triage Evaluation Note  Victoria Andrews , a 26 y.o. female  was evaluated in triage.  Pt complains of fever, chills, nausea.  Hernia surgery 3 weeks ago.  Some pressure in her chest..  Review of Systems  Positive:  Negative:   Physical Exam  BP 106/70 (BP Location: Left Arm)   Pulse 65   Temp 98.6 F (37 C) (Oral)   Resp 18   SpO2 100%  Gen:   Awake, no distress   Resp:  Normal effort  MSK:   Moves extremities without difficulty  Other:    Medical Decision Making  Medically screening exam initiated at 9:39 AM.  Appropriate orders placed.  Victoria Andrews was informed that the remainder of the evaluation will be completed by another provider, this initial triage assessment does not replace that evaluation, and the importance of remaining in the ED until their evaluation is complete.     Faythe Ghee, PA-C 12/03/22 [phone]

## 2022-12-03 NOTE — ED Notes (Signed)
See triage note  Presents with cont's n/v  and fatigue  States she had hernia surgery recently and has had problems since  Has been seen at several places for same

## 2022-12-03 NOTE — ED Provider Notes (Signed)
[facility] Provider Note    Event Date/Time   First MD Initiated Contact with Patient 12/03/22 1004     (approximate)   History   Chief Complaint Abdominal Pain and Nausea   HPI  Victoria Andrews is a 26 y.o. female with past medical history of intractable nausea and vomiting, gastric sleeve, hiatal hernia status postrepair, and cholecystectomy who presents to the ED complaining of abdominal pain and nausea.  Patient reports that she underwent her second hiatal hernia repair at [facility] about 3 weeks ago.  She did not seem to have any problems initially, but states she has been dealing with increasing nausea and vomiting over the past week.  She states she has only been able to tolerate small sips of water at a time, has had very little solids for the past week.  She persistently feels nauseous with even moving or speaking, reports frequent dry heaving.  This been associated with pain in both sides of her upper abdomen.  She additionally reports sharp pain in the center of her chest and some mild difficulty breathing.  She has not had any fevers or cough, denies any pain or swelling in her legs.  She reports speaking with her surgeon at [facility], who told her that the symptoms seem unrelated to her surgery.  She was previously diagnosed with cannabinoid hyperemesis syndrome during [facility] 6 days ago, states she has not smoked marijuana since then.     Physical Exam   Triage Vital Signs: ED Triage Vitals [12/03/22 0939]  Enc Vitals Group     BP 106/70     Pulse Rate 65     Resp 18     Temp 98.6 F (37 C)     Temp Source Oral     SpO2 100 %     Weight 145 lb 1 oz (65.8 kg)     Height 5\' 5"  (1.651 m)     Head Circumference      Peak Flow      Pain Score 6     Pain Loc      Pain Edu?      Excl. in GC?     Most recent vital signs: Vitals:   12/03/22 0939  BP: 106/70  Pulse: 65  Resp: 18  Temp: 98.6 F (37 C)  SpO2: 100%     Constitutional: Alert and oriented. Eyes: Conjunctivae are normal. Head: Atraumatic. Nose: No congestion/rhinnorhea. Mouth/Throat: Mucous membranes are dry.  Cardiovascular: Normal rate, regular rhythm. Grossly normal heart sounds.  2+ radial pulses bilaterally. Respiratory: Normal respiratory effort.  No retractions. Lungs CTAB. Gastrointestinal: Soft and diffusely tender to palpation with no rebound or guarding. No distention. Musculoskeletal: No lower extremity tenderness nor edema.  Neurologic:  Normal speech and language. No gross focal neurologic deficits are appreciated.    ED Results / Procedures / Treatments   Labs (all labs ordered are listed, but only abnormal results are displayed) Labs Reviewed  LIPASE, BLOOD - Abnormal; Notable for the following components:      Result Value   Lipase 65 (*)    All other components within normal limits  COMPREHENSIVE METABOLIC PANEL - Abnormal; Notable for the following components:   CO2 20 (*)    AST 11 (*)    Total Bilirubin 1.6 (*)    All other components within normal limits  URINALYSIS, ROUTINE W REFLEX MICROSCOPIC - Abnormal; Notable for the following components:   Color, Urine  YELLOW (*)    APPearance CLEAR (*)    Ketones, ur 80 (*)    All other components within normal limits  D-DIMER, QUANTITATIVE - Abnormal; Notable for the following components:   D-Dimer, Quant 0.58 (*)    All other components within normal limits  CBC  HCG, QUANTITATIVE, PREGNANCY     EKG  ED ECG REPORT I, Chesley Noon, the attending physician, personally viewed and interpreted this ECG.   Date: 12/03/2022  EKG Time: 9:44  Rate: 54  Rhythm: sinus bradycardia  Axis: Normal  Intervals:none  ST&T Change: None  RADIOLOGY CTA chest reviewed and interpreted by me with no pulmonary embolism or infiltrate noted.  CT abdomen/pelvis reviewed and interpreted by me with no inflammatory changes, dilated bowel loops, or focal fluid  collections.  PROCEDURES:  Critical Care performed: No  Procedures   MEDICATIONS ORDERED IN ED: Medications  lactated ringers bolus 1,000 mL (1,000 mLs Intravenous New Bag/Given 12/03/22 1101)  ondansetron (ZOFRAN) injection 4 mg (4 mg Intravenous Given 12/03/22 1115)  droperidol (INAPSINE) 2.5 MG/ML injection 2.5 mg (2.5 mg Intravenous Given 12/03/22 1215)  iohexol (OMNIPAQUE) 350 MG/ML injection 75 mL (75 mLs Intravenous Contrast Given 12/03/22 1159)     IMPRESSION / MDM / ASSESSMENT AND PLAN / ED COURSE  I reviewed the triage vital signs and the nursing notes.                              26 y.o. female with past medical history of recurrent nausea and vomiting, hiatal hernia status postrepair, gastric sleeve procedure, and cholecystectomy who presents to the ED complaining of recurrent nausea and vomiting along with upper abdominal pain, chest pain, and shortness of breath for the past week.  Patient's presentation is most consistent with acute presentation with potential threat to life or bodily function.  Differential diagnosis includes, but is not limited to, intractable nausea and vomiting, dehydration, AKI, electrolyte abnormality, UTI, bowel obstruction, surgical complication, PE, ACS, pneumonia.  Patient well-appearing and in no acute distress, vital signs are unremarkable.  EKG shows no evidence of arrhythmia or ischemia, QTc within normal limits.  She does appear dehydrated and we will hydrate with IV fluids, anticipate treating with IV droperidol if pregnancy testing is negative.  Labs thus far are reassuring with no significant anemia, leukocytosis, electrolyte abnormality, or AKI.  She does have very mild elevation in her lipase, which could be from recurrent vomiting.  Mild elevation in bilirubin also noted however remainder of LFTs are unremarkable.  We will further assess with CT imaging of her abdomen/pelvis, D-dimer also noted to be elevated and we will check CTA of her  chest.  CTA chest is negative for acute process, CT of abdomen/pelvis is also unremarkable outside of ovarian cyst.  Do not suspect torsion at this time as she has no lower abdominal discomfort.  Patient does report feeling better following IV droperidol and IV fluid bolus.  She was offered admission for intractable nausea and vomiting, but is requesting be discharged home.  We will provide refill for Zofran and Reglan, patient reports she has an appointment scheduled to establish care with GI next month, was also provided with referral to establish care with PCP.  She was counseled to return to the ED for new or worsening symptoms, patient agrees with plan.      FINAL CLINICAL IMPRESSION(S) / ED DIAGNOSES   Final diagnoses:  Cyclic vomiting syndrome  Rx / DC Orders   ED Discharge Orders          Ordered    ondansetron (ZOFRAN-ODT) 4 MG disintegrating tablet  Every 8 hours PRN        12/03/22 1324    metoCLOPramide (REGLAN) 10 MG tablet  Every 8 hours PRN        12/03/22 1324             Note:  This document was prepared using Dragon voice recognition software and may include unintentional dictation errors.   Chesley Noon, MD 12/03/22 1327

## 2022-12-03 NOTE — ED Triage Notes (Signed)
Pt here with fever and fatigue. Pt was dx with CHS last week. Pt having nausea and fever at home. Pt also had a hernia repair surgery 3 weeks ago. Pt states talking and breathing makes her want to throw up. Pt stable in triage with family.

## 2022-12-17 DIAGNOSIS — K219 Gastro-esophageal reflux disease without esophagitis: Secondary | ICD-10-CM | POA: Diagnosis not present

## 2022-12-17 DIAGNOSIS — E876 Hypokalemia: Secondary | ICD-10-CM | POA: Diagnosis not present

## 2022-12-17 DIAGNOSIS — R112 Nausea with vomiting, unspecified: Secondary | ICD-10-CM | POA: Diagnosis not present

## 2022-12-17 DIAGNOSIS — Z6825 Body mass index (BMI) 25.0-25.9, adult: Secondary | ICD-10-CM | POA: Diagnosis not present

## 2022-12-17 DIAGNOSIS — F1290 Cannabis use, unspecified, uncomplicated: Secondary | ICD-10-CM | POA: Diagnosis not present

## 2022-12-17 DIAGNOSIS — F319 Bipolar disorder, unspecified: Secondary | ICD-10-CM | POA: Diagnosis not present

## 2022-12-17 DIAGNOSIS — Z79899 Other long term (current) drug therapy: Secondary | ICD-10-CM | POA: Diagnosis not present

## 2022-12-17 DIAGNOSIS — R109 Unspecified abdominal pain: Secondary | ICD-10-CM | POA: Diagnosis not present

## 2022-12-17 DIAGNOSIS — E860 Dehydration: Secondary | ICD-10-CM | POA: Diagnosis not present

## 2022-12-17 DIAGNOSIS — F419 Anxiety disorder, unspecified: Secondary | ICD-10-CM | POA: Diagnosis not present

## 2022-12-17 DIAGNOSIS — Z9049 Acquired absence of other specified parts of digestive tract: Secondary | ICD-10-CM | POA: Diagnosis not present

## 2022-12-17 DIAGNOSIS — E43 Unspecified severe protein-calorie malnutrition: Secondary | ICD-10-CM | POA: Diagnosis not present

## 2022-12-17 DIAGNOSIS — R079 Chest pain, unspecified: Secondary | ICD-10-CM | POA: Diagnosis not present

## 2022-12-17 DIAGNOSIS — Z903 Acquired absence of stomach [part of]: Secondary | ICD-10-CM | POA: Diagnosis not present

## 2022-12-18 DIAGNOSIS — R112 Nausea with vomiting, unspecified: Secondary | ICD-10-CM | POA: Diagnosis not present

## 2022-12-18 DIAGNOSIS — K219 Gastro-esophageal reflux disease without esophagitis: Secondary | ICD-10-CM | POA: Diagnosis not present

## 2022-12-18 DIAGNOSIS — E860 Dehydration: Secondary | ICD-10-CM | POA: Diagnosis not present

## 2022-12-18 DIAGNOSIS — Z9884 Bariatric surgery status: Secondary | ICD-10-CM | POA: Diagnosis not present

## 2022-12-18 DIAGNOSIS — F419 Anxiety disorder, unspecified: Secondary | ICD-10-CM | POA: Diagnosis not present

## 2022-12-18 DIAGNOSIS — R079 Chest pain, unspecified: Secondary | ICD-10-CM | POA: Diagnosis not present

## 2022-12-18 DIAGNOSIS — Z6825 Body mass index (BMI) 25.0-25.9, adult: Secondary | ICD-10-CM | POA: Diagnosis not present

## 2022-12-18 DIAGNOSIS — F1290 Cannabis use, unspecified, uncomplicated: Secondary | ICD-10-CM | POA: Diagnosis not present

## 2022-12-18 DIAGNOSIS — Z9049 Acquired absence of other specified parts of digestive tract: Secondary | ICD-10-CM | POA: Diagnosis not present

## 2022-12-18 DIAGNOSIS — E43 Unspecified severe protein-calorie malnutrition: Secondary | ICD-10-CM | POA: Diagnosis not present

## 2022-12-18 DIAGNOSIS — N179 Acute kidney failure, unspecified: Secondary | ICD-10-CM | POA: Diagnosis not present

## 2022-12-18 DIAGNOSIS — R109 Unspecified abdominal pain: Secondary | ICD-10-CM | POA: Diagnosis not present

## 2022-12-18 DIAGNOSIS — Z79899 Other long term (current) drug therapy: Secondary | ICD-10-CM | POA: Diagnosis not present

## 2022-12-18 DIAGNOSIS — F319 Bipolar disorder, unspecified: Secondary | ICD-10-CM | POA: Diagnosis not present

## 2022-12-18 DIAGNOSIS — Z903 Acquired absence of stomach [part of]: Secondary | ICD-10-CM | POA: Diagnosis not present

## 2022-12-18 DIAGNOSIS — E876 Hypokalemia: Secondary | ICD-10-CM | POA: Diagnosis not present

## 2022-12-19 DIAGNOSIS — E43 Unspecified severe protein-calorie malnutrition: Secondary | ICD-10-CM | POA: Diagnosis not present

## 2022-12-19 DIAGNOSIS — Z903 Acquired absence of stomach [part of]: Secondary | ICD-10-CM | POA: Diagnosis not present

## 2022-12-19 DIAGNOSIS — Z9049 Acquired absence of other specified parts of digestive tract: Secondary | ICD-10-CM | POA: Diagnosis not present

## 2022-12-19 DIAGNOSIS — F419 Anxiety disorder, unspecified: Secondary | ICD-10-CM | POA: Diagnosis not present

## 2022-12-19 DIAGNOSIS — F1290 Cannabis use, unspecified, uncomplicated: Secondary | ICD-10-CM | POA: Diagnosis not present

## 2022-12-19 DIAGNOSIS — R001 Bradycardia, unspecified: Secondary | ICD-10-CM | POA: Diagnosis not present

## 2022-12-19 DIAGNOSIS — R109 Unspecified abdominal pain: Secondary | ICD-10-CM | POA: Diagnosis not present

## 2022-12-19 DIAGNOSIS — Z79899 Other long term (current) drug therapy: Secondary | ICD-10-CM | POA: Diagnosis not present

## 2022-12-19 DIAGNOSIS — E876 Hypokalemia: Secondary | ICD-10-CM | POA: Diagnosis not present

## 2022-12-19 DIAGNOSIS — R112 Nausea with vomiting, unspecified: Secondary | ICD-10-CM | POA: Diagnosis not present

## 2022-12-19 DIAGNOSIS — R079 Chest pain, unspecified: Secondary | ICD-10-CM | POA: Diagnosis not present

## 2022-12-19 DIAGNOSIS — K219 Gastro-esophageal reflux disease without esophagitis: Secondary | ICD-10-CM | POA: Diagnosis not present

## 2022-12-19 DIAGNOSIS — E860 Dehydration: Secondary | ICD-10-CM | POA: Diagnosis not present

## 2022-12-19 DIAGNOSIS — F4310 Post-traumatic stress disorder, unspecified: Secondary | ICD-10-CM | POA: Diagnosis not present

## 2022-12-19 DIAGNOSIS — F319 Bipolar disorder, unspecified: Secondary | ICD-10-CM | POA: Diagnosis not present

## 2022-12-19 DIAGNOSIS — Z6825 Body mass index (BMI) 25.0-25.9, adult: Secondary | ICD-10-CM | POA: Diagnosis not present

## 2022-12-20 DIAGNOSIS — K219 Gastro-esophageal reflux disease without esophagitis: Secondary | ICD-10-CM | POA: Diagnosis not present

## 2022-12-20 DIAGNOSIS — E860 Dehydration: Secondary | ICD-10-CM | POA: Diagnosis not present

## 2022-12-20 DIAGNOSIS — F319 Bipolar disorder, unspecified: Secondary | ICD-10-CM | POA: Diagnosis not present

## 2022-12-20 DIAGNOSIS — E43 Unspecified severe protein-calorie malnutrition: Secondary | ICD-10-CM | POA: Diagnosis not present

## 2022-12-20 DIAGNOSIS — Z903 Acquired absence of stomach [part of]: Secondary | ICD-10-CM | POA: Diagnosis not present

## 2022-12-20 DIAGNOSIS — R109 Unspecified abdominal pain: Secondary | ICD-10-CM | POA: Diagnosis not present

## 2022-12-20 DIAGNOSIS — R112 Nausea with vomiting, unspecified: Secondary | ICD-10-CM | POA: Diagnosis not present

## 2022-12-20 DIAGNOSIS — E876 Hypokalemia: Secondary | ICD-10-CM | POA: Diagnosis not present

## 2022-12-20 DIAGNOSIS — F1290 Cannabis use, unspecified, uncomplicated: Secondary | ICD-10-CM | POA: Diagnosis not present

## 2022-12-20 DIAGNOSIS — F419 Anxiety disorder, unspecified: Secondary | ICD-10-CM | POA: Diagnosis not present

## 2022-12-20 DIAGNOSIS — R079 Chest pain, unspecified: Secondary | ICD-10-CM | POA: Diagnosis not present

## 2022-12-20 DIAGNOSIS — Z6825 Body mass index (BMI) 25.0-25.9, adult: Secondary | ICD-10-CM | POA: Diagnosis not present

## 2022-12-20 DIAGNOSIS — Z79899 Other long term (current) drug therapy: Secondary | ICD-10-CM | POA: Diagnosis not present

## 2022-12-20 DIAGNOSIS — F4310 Post-traumatic stress disorder, unspecified: Secondary | ICD-10-CM | POA: Diagnosis not present

## 2022-12-20 DIAGNOSIS — Z9049 Acquired absence of other specified parts of digestive tract: Secondary | ICD-10-CM | POA: Diagnosis not present

## 2022-12-21 DIAGNOSIS — R079 Chest pain, unspecified: Secondary | ICD-10-CM | POA: Diagnosis not present

## 2022-12-21 DIAGNOSIS — R109 Unspecified abdominal pain: Secondary | ICD-10-CM | POA: Diagnosis not present

## 2022-12-21 DIAGNOSIS — Z6825 Body mass index (BMI) 25.0-25.9, adult: Secondary | ICD-10-CM | POA: Diagnosis not present

## 2022-12-21 DIAGNOSIS — F419 Anxiety disorder, unspecified: Secondary | ICD-10-CM | POA: Diagnosis not present

## 2022-12-21 DIAGNOSIS — E876 Hypokalemia: Secondary | ICD-10-CM | POA: Diagnosis not present

## 2022-12-21 DIAGNOSIS — E860 Dehydration: Secondary | ICD-10-CM | POA: Diagnosis not present

## 2022-12-21 DIAGNOSIS — E43 Unspecified severe protein-calorie malnutrition: Secondary | ICD-10-CM | POA: Diagnosis not present

## 2022-12-21 DIAGNOSIS — Z79899 Other long term (current) drug therapy: Secondary | ICD-10-CM | POA: Diagnosis not present

## 2022-12-21 DIAGNOSIS — F319 Bipolar disorder, unspecified: Secondary | ICD-10-CM | POA: Diagnosis not present

## 2022-12-21 DIAGNOSIS — Z9049 Acquired absence of other specified parts of digestive tract: Secondary | ICD-10-CM | POA: Diagnosis not present

## 2022-12-21 DIAGNOSIS — R112 Nausea with vomiting, unspecified: Secondary | ICD-10-CM | POA: Diagnosis not present

## 2022-12-21 DIAGNOSIS — K219 Gastro-esophageal reflux disease without esophagitis: Secondary | ICD-10-CM | POA: Diagnosis not present

## 2022-12-21 DIAGNOSIS — F1290 Cannabis use, unspecified, uncomplicated: Secondary | ICD-10-CM | POA: Diagnosis not present

## 2022-12-21 DIAGNOSIS — Z903 Acquired absence of stomach [part of]: Secondary | ICD-10-CM | POA: Diagnosis not present

## 2022-12-22 DIAGNOSIS — R079 Chest pain, unspecified: Secondary | ICD-10-CM | POA: Diagnosis not present

## 2022-12-22 DIAGNOSIS — E876 Hypokalemia: Secondary | ICD-10-CM | POA: Diagnosis not present

## 2022-12-22 DIAGNOSIS — K219 Gastro-esophageal reflux disease without esophagitis: Secondary | ICD-10-CM | POA: Diagnosis not present

## 2022-12-22 DIAGNOSIS — Z79899 Other long term (current) drug therapy: Secondary | ICD-10-CM | POA: Diagnosis not present

## 2022-12-22 DIAGNOSIS — Z9049 Acquired absence of other specified parts of digestive tract: Secondary | ICD-10-CM | POA: Diagnosis not present

## 2022-12-22 DIAGNOSIS — F419 Anxiety disorder, unspecified: Secondary | ICD-10-CM | POA: Diagnosis not present

## 2022-12-22 DIAGNOSIS — F319 Bipolar disorder, unspecified: Secondary | ICD-10-CM | POA: Diagnosis not present

## 2022-12-22 DIAGNOSIS — R112 Nausea with vomiting, unspecified: Secondary | ICD-10-CM | POA: Diagnosis not present

## 2022-12-22 DIAGNOSIS — E43 Unspecified severe protein-calorie malnutrition: Secondary | ICD-10-CM | POA: Diagnosis not present

## 2022-12-22 DIAGNOSIS — R109 Unspecified abdominal pain: Secondary | ICD-10-CM | POA: Diagnosis not present

## 2022-12-22 DIAGNOSIS — F1290 Cannabis use, unspecified, uncomplicated: Secondary | ICD-10-CM | POA: Diagnosis not present

## 2022-12-22 DIAGNOSIS — E860 Dehydration: Secondary | ICD-10-CM | POA: Diagnosis not present

## 2022-12-22 DIAGNOSIS — Z903 Acquired absence of stomach [part of]: Secondary | ICD-10-CM | POA: Diagnosis not present

## 2022-12-22 DIAGNOSIS — Z6825 Body mass index (BMI) 25.0-25.9, adult: Secondary | ICD-10-CM | POA: Diagnosis not present

## 2022-12-23 DIAGNOSIS — K449 Diaphragmatic hernia without obstruction or gangrene: Secondary | ICD-10-CM | POA: Diagnosis not present

## 2022-12-23 DIAGNOSIS — Z79899 Other long term (current) drug therapy: Secondary | ICD-10-CM | POA: Diagnosis not present

## 2022-12-23 DIAGNOSIS — R109 Unspecified abdominal pain: Secondary | ICD-10-CM | POA: Diagnosis not present

## 2022-12-23 DIAGNOSIS — Z903 Acquired absence of stomach [part of]: Secondary | ICD-10-CM | POA: Diagnosis not present

## 2022-12-23 DIAGNOSIS — R112 Nausea with vomiting, unspecified: Secondary | ICD-10-CM | POA: Diagnosis not present

## 2022-12-23 DIAGNOSIS — F1290 Cannabis use, unspecified, uncomplicated: Secondary | ICD-10-CM | POA: Diagnosis not present

## 2022-12-23 DIAGNOSIS — F419 Anxiety disorder, unspecified: Secondary | ICD-10-CM | POA: Diagnosis not present

## 2022-12-23 DIAGNOSIS — Z9049 Acquired absence of other specified parts of digestive tract: Secondary | ICD-10-CM | POA: Diagnosis not present

## 2022-12-23 DIAGNOSIS — K219 Gastro-esophageal reflux disease without esophagitis: Secondary | ICD-10-CM | POA: Diagnosis not present

## 2022-12-23 DIAGNOSIS — R079 Chest pain, unspecified: Secondary | ICD-10-CM | POA: Diagnosis not present

## 2022-12-23 DIAGNOSIS — R110 Nausea: Secondary | ICD-10-CM | POA: Diagnosis not present

## 2022-12-23 DIAGNOSIS — Z6825 Body mass index (BMI) 25.0-25.9, adult: Secondary | ICD-10-CM | POA: Diagnosis not present

## 2022-12-23 DIAGNOSIS — E876 Hypokalemia: Secondary | ICD-10-CM | POA: Diagnosis not present

## 2022-12-23 DIAGNOSIS — E860 Dehydration: Secondary | ICD-10-CM | POA: Diagnosis not present

## 2022-12-23 DIAGNOSIS — F319 Bipolar disorder, unspecified: Secondary | ICD-10-CM | POA: Diagnosis not present

## 2022-12-23 DIAGNOSIS — E43 Unspecified severe protein-calorie malnutrition: Secondary | ICD-10-CM | POA: Diagnosis not present

## 2022-12-24 DIAGNOSIS — F419 Anxiety disorder, unspecified: Secondary | ICD-10-CM | POA: Diagnosis not present

## 2022-12-24 DIAGNOSIS — R112 Nausea with vomiting, unspecified: Secondary | ICD-10-CM | POA: Diagnosis not present

## 2022-12-24 DIAGNOSIS — Z6825 Body mass index (BMI) 25.0-25.9, adult: Secondary | ICD-10-CM | POA: Diagnosis not present

## 2022-12-24 DIAGNOSIS — Z903 Acquired absence of stomach [part of]: Secondary | ICD-10-CM | POA: Diagnosis not present

## 2022-12-24 DIAGNOSIS — F1290 Cannabis use, unspecified, uncomplicated: Secondary | ICD-10-CM | POA: Diagnosis not present

## 2022-12-24 DIAGNOSIS — F319 Bipolar disorder, unspecified: Secondary | ICD-10-CM | POA: Diagnosis not present

## 2022-12-24 DIAGNOSIS — Z79899 Other long term (current) drug therapy: Secondary | ICD-10-CM | POA: Diagnosis not present

## 2022-12-24 DIAGNOSIS — K219 Gastro-esophageal reflux disease without esophagitis: Secondary | ICD-10-CM | POA: Diagnosis not present

## 2022-12-24 DIAGNOSIS — E43 Unspecified severe protein-calorie malnutrition: Secondary | ICD-10-CM | POA: Diagnosis not present

## 2022-12-24 DIAGNOSIS — R109 Unspecified abdominal pain: Secondary | ICD-10-CM | POA: Diagnosis not present

## 2022-12-24 DIAGNOSIS — Z9049 Acquired absence of other specified parts of digestive tract: Secondary | ICD-10-CM | POA: Diagnosis not present

## 2022-12-24 DIAGNOSIS — R079 Chest pain, unspecified: Secondary | ICD-10-CM | POA: Diagnosis not present

## 2022-12-24 DIAGNOSIS — E860 Dehydration: Secondary | ICD-10-CM | POA: Diagnosis not present

## 2022-12-24 DIAGNOSIS — E876 Hypokalemia: Secondary | ICD-10-CM | POA: Diagnosis not present

## 2022-12-25 DIAGNOSIS — E876 Hypokalemia: Secondary | ICD-10-CM | POA: Diagnosis not present

## 2022-12-25 DIAGNOSIS — R112 Nausea with vomiting, unspecified: Secondary | ICD-10-CM | POA: Diagnosis not present

## 2022-12-25 DIAGNOSIS — F329 Major depressive disorder, single episode, unspecified: Secondary | ICD-10-CM | POA: Diagnosis not present

## 2022-12-25 DIAGNOSIS — E46 Unspecified protein-calorie malnutrition: Secondary | ICD-10-CM | POA: Diagnosis not present

## 2022-12-25 DIAGNOSIS — Z79899 Other long term (current) drug therapy: Secondary | ICD-10-CM | POA: Diagnosis not present

## 2022-12-25 DIAGNOSIS — R109 Unspecified abdominal pain: Secondary | ICD-10-CM | POA: Diagnosis not present

## 2022-12-25 DIAGNOSIS — R079 Chest pain, unspecified: Secondary | ICD-10-CM | POA: Diagnosis not present

## 2022-12-25 DIAGNOSIS — Z903 Acquired absence of stomach [part of]: Secondary | ICD-10-CM | POA: Diagnosis not present

## 2022-12-25 DIAGNOSIS — K219 Gastro-esophageal reflux disease without esophagitis: Secondary | ICD-10-CM | POA: Diagnosis not present

## 2022-12-25 DIAGNOSIS — F319 Bipolar disorder, unspecified: Secondary | ICD-10-CM | POA: Diagnosis not present

## 2022-12-25 DIAGNOSIS — F1290 Cannabis use, unspecified, uncomplicated: Secondary | ICD-10-CM | POA: Diagnosis not present

## 2022-12-25 DIAGNOSIS — R1110 Vomiting, unspecified: Secondary | ICD-10-CM | POA: Diagnosis not present

## 2022-12-25 DIAGNOSIS — Z9049 Acquired absence of other specified parts of digestive tract: Secondary | ICD-10-CM | POA: Diagnosis not present

## 2022-12-25 DIAGNOSIS — F419 Anxiety disorder, unspecified: Secondary | ICD-10-CM | POA: Diagnosis not present

## 2022-12-25 DIAGNOSIS — E43 Unspecified severe protein-calorie malnutrition: Secondary | ICD-10-CM | POA: Diagnosis not present

## 2022-12-25 DIAGNOSIS — E860 Dehydration: Secondary | ICD-10-CM | POA: Diagnosis not present

## 2022-12-25 DIAGNOSIS — Z6825 Body mass index (BMI) 25.0-25.9, adult: Secondary | ICD-10-CM | POA: Diagnosis not present

## 2022-12-26 DIAGNOSIS — F1290 Cannabis use, unspecified, uncomplicated: Secondary | ICD-10-CM | POA: Diagnosis not present

## 2022-12-26 DIAGNOSIS — F329 Major depressive disorder, single episode, unspecified: Secondary | ICD-10-CM | POA: Diagnosis not present

## 2022-12-26 DIAGNOSIS — F09 Unspecified mental disorder due to known physiological condition: Secondary | ICD-10-CM | POA: Diagnosis not present

## 2022-12-26 DIAGNOSIS — F419 Anxiety disorder, unspecified: Secondary | ICD-10-CM | POA: Diagnosis not present

## 2022-12-26 DIAGNOSIS — Z9884 Bariatric surgery status: Secondary | ICD-10-CM | POA: Diagnosis not present

## 2022-12-26 DIAGNOSIS — E43 Unspecified severe protein-calorie malnutrition: Secondary | ICD-10-CM | POA: Diagnosis not present

## 2022-12-26 DIAGNOSIS — I951 Orthostatic hypotension: Secondary | ICD-10-CM | POA: Diagnosis not present

## 2022-12-26 DIAGNOSIS — K219 Gastro-esophageal reflux disease without esophagitis: Secondary | ICD-10-CM | POA: Diagnosis not present

## 2022-12-26 DIAGNOSIS — R55 Syncope and collapse: Secondary | ICD-10-CM | POA: Diagnosis not present

## 2022-12-26 DIAGNOSIS — I959 Hypotension, unspecified: Secondary | ICD-10-CM | POA: Diagnosis not present

## 2022-12-26 DIAGNOSIS — Z881 Allergy status to other antibiotic agents status: Secondary | ICD-10-CM | POA: Diagnosis not present

## 2022-12-26 DIAGNOSIS — R112 Nausea with vomiting, unspecified: Secondary | ICD-10-CM | POA: Diagnosis not present

## 2022-12-26 DIAGNOSIS — R1110 Vomiting, unspecified: Secondary | ICD-10-CM | POA: Diagnosis not present

## 2022-12-26 DIAGNOSIS — F319 Bipolar disorder, unspecified: Secondary | ICD-10-CM | POA: Diagnosis not present

## 2022-12-26 DIAGNOSIS — Z6825 Body mass index (BMI) 25.0-25.9, adult: Secondary | ICD-10-CM | POA: Diagnosis not present

## 2022-12-26 DIAGNOSIS — F4310 Post-traumatic stress disorder, unspecified: Secondary | ICD-10-CM | POA: Diagnosis not present

## 2022-12-26 DIAGNOSIS — E46 Unspecified protein-calorie malnutrition: Secondary | ICD-10-CM | POA: Diagnosis not present

## 2022-12-26 DIAGNOSIS — F12188 Cannabis abuse with other cannabis-induced disorder: Secondary | ICD-10-CM | POA: Diagnosis not present

## 2022-12-26 DIAGNOSIS — Z79899 Other long term (current) drug therapy: Secondary | ICD-10-CM | POA: Diagnosis not present

## 2022-12-26 DIAGNOSIS — E8809 Other disorders of plasma-protein metabolism, not elsewhere classified: Secondary | ICD-10-CM | POA: Diagnosis not present

## 2022-12-26 DIAGNOSIS — Z9049 Acquired absence of other specified parts of digestive tract: Secondary | ICD-10-CM | POA: Diagnosis not present

## 2022-12-26 DIAGNOSIS — E876 Hypokalemia: Secondary | ICD-10-CM | POA: Diagnosis not present

## 2022-12-26 DIAGNOSIS — Z903 Acquired absence of stomach [part of]: Secondary | ICD-10-CM | POA: Diagnosis not present

## 2022-12-26 DIAGNOSIS — E860 Dehydration: Secondary | ICD-10-CM | POA: Diagnosis not present

## 2022-12-27 DIAGNOSIS — R55 Syncope and collapse: Secondary | ICD-10-CM | POA: Diagnosis not present

## 2022-12-27 DIAGNOSIS — R112 Nausea with vomiting, unspecified: Secondary | ICD-10-CM | POA: Diagnosis not present

## 2022-12-27 DIAGNOSIS — E46 Unspecified protein-calorie malnutrition: Secondary | ICD-10-CM | POA: Diagnosis not present

## 2022-12-28 DIAGNOSIS — R112 Nausea with vomiting, unspecified: Secondary | ICD-10-CM | POA: Diagnosis not present

## 2022-12-28 DIAGNOSIS — E46 Unspecified protein-calorie malnutrition: Secondary | ICD-10-CM | POA: Diagnosis not present

## 2022-12-29 DIAGNOSIS — R112 Nausea with vomiting, unspecified: Secondary | ICD-10-CM | POA: Diagnosis not present

## 2022-12-29 DIAGNOSIS — E46 Unspecified protein-calorie malnutrition: Secondary | ICD-10-CM | POA: Diagnosis not present

## 2022-12-30 DIAGNOSIS — E46 Unspecified protein-calorie malnutrition: Secondary | ICD-10-CM | POA: Diagnosis not present

## 2022-12-30 DIAGNOSIS — R112 Nausea with vomiting, unspecified: Secondary | ICD-10-CM | POA: Diagnosis not present

## 2022-12-31 DIAGNOSIS — E46 Unspecified protein-calorie malnutrition: Secondary | ICD-10-CM | POA: Diagnosis not present

## 2022-12-31 DIAGNOSIS — R112 Nausea with vomiting, unspecified: Secondary | ICD-10-CM | POA: Diagnosis not present

## 2023-01-01 DIAGNOSIS — E46 Unspecified protein-calorie malnutrition: Secondary | ICD-10-CM | POA: Diagnosis not present

## 2023-01-01 DIAGNOSIS — R112 Nausea with vomiting, unspecified: Secondary | ICD-10-CM | POA: Diagnosis not present

## 2023-01-02 DIAGNOSIS — R112 Nausea with vomiting, unspecified: Secondary | ICD-10-CM | POA: Diagnosis not present

## 2023-01-05 DIAGNOSIS — R112 Nausea with vomiting, unspecified: Secondary | ICD-10-CM | POA: Diagnosis not present

## 2023-01-05 DIAGNOSIS — E46 Unspecified protein-calorie malnutrition: Secondary | ICD-10-CM | POA: Diagnosis not present

## 2023-01-06 DIAGNOSIS — Z760 Encounter for issue of repeat prescription: Secondary | ICD-10-CM | POA: Diagnosis not present

## 2023-01-06 DIAGNOSIS — Z9884 Bariatric surgery status: Secondary | ICD-10-CM | POA: Diagnosis not present

## 2023-01-06 DIAGNOSIS — E46 Unspecified protein-calorie malnutrition: Secondary | ICD-10-CM | POA: Diagnosis not present

## 2023-01-06 DIAGNOSIS — R112 Nausea with vomiting, unspecified: Secondary | ICD-10-CM | POA: Diagnosis not present

## 2023-01-07 DIAGNOSIS — E46 Unspecified protein-calorie malnutrition: Secondary | ICD-10-CM | POA: Diagnosis not present

## 2023-01-07 DIAGNOSIS — R112 Nausea with vomiting, unspecified: Secondary | ICD-10-CM | POA: Diagnosis not present

## 2023-01-08 DIAGNOSIS — E46 Unspecified protein-calorie malnutrition: Secondary | ICD-10-CM | POA: Diagnosis not present

## 2023-01-08 DIAGNOSIS — R112 Nausea with vomiting, unspecified: Secondary | ICD-10-CM | POA: Diagnosis not present

## 2023-01-09 DIAGNOSIS — E46 Unspecified protein-calorie malnutrition: Secondary | ICD-10-CM | POA: Diagnosis not present

## 2023-01-09 DIAGNOSIS — R112 Nausea with vomiting, unspecified: Secondary | ICD-10-CM | POA: Diagnosis not present

## 2023-01-10 ENCOUNTER — Encounter (HOSPITAL_COMMUNITY): Payer: Self-pay

## 2023-01-10 ENCOUNTER — Inpatient Hospital Stay (HOSPITAL_COMMUNITY)
Admission: EM | Admit: 2023-01-10 | Discharge: 2023-01-17 | DRG: 314 | Disposition: A | Discharge status: Home / Self Care | Payer: BC Managed Care – PPO | Attending: Internal Medicine | Admitting: Internal Medicine

## 2023-01-10 ENCOUNTER — Telehealth: Payer: BC Managed Care – PPO | Admitting: Family

## 2023-01-10 ENCOUNTER — Telehealth: Payer: Self-pay

## 2023-01-10 ENCOUNTER — Emergency Department (HOSPITAL_COMMUNITY): Payer: BC Managed Care – PPO

## 2023-01-10 ENCOUNTER — Other Ambulatory Visit: Payer: Self-pay

## 2023-01-10 ENCOUNTER — Telehealth: Payer: Self-pay | Admitting: Family

## 2023-01-10 ENCOUNTER — Encounter: Payer: Self-pay | Admitting: Family

## 2023-01-10 VITALS — BP 102/69 | HR 102 | Temp 98.5°F | Ht 65.0 in | Wt 152.0 lb

## 2023-01-10 DIAGNOSIS — R1115 Cyclical vomiting syndrome unrelated to migraine: Secondary | ICD-10-CM | POA: Diagnosis not present

## 2023-01-10 DIAGNOSIS — D696 Thrombocytopenia, unspecified: Secondary | ICD-10-CM | POA: Diagnosis not present

## 2023-01-10 DIAGNOSIS — R7989 Other specified abnormal findings of blood chemistry: Secondary | ICD-10-CM

## 2023-01-10 DIAGNOSIS — R578 Other shock: Secondary | ICD-10-CM | POA: Diagnosis not present

## 2023-01-10 DIAGNOSIS — R6521 Severe sepsis with septic shock: Secondary | ICD-10-CM | POA: Diagnosis present

## 2023-01-10 DIAGNOSIS — E559 Vitamin D deficiency, unspecified: Secondary | ICD-10-CM | POA: Diagnosis not present

## 2023-01-10 DIAGNOSIS — E876 Hypokalemia: Secondary | ICD-10-CM | POA: Diagnosis not present

## 2023-01-10 DIAGNOSIS — A419 Sepsis, unspecified organism: Secondary | ICD-10-CM | POA: Diagnosis present

## 2023-01-10 DIAGNOSIS — Z881 Allergy status to other antibiotic agents status: Secondary | ICD-10-CM

## 2023-01-10 DIAGNOSIS — Y848 Other medical procedures as the cause of abnormal reaction of the patient, or of later complication, without mention of misadventure at the time of the procedure: Secondary | ICD-10-CM | POA: Diagnosis present

## 2023-01-10 DIAGNOSIS — R748 Abnormal levels of other serum enzymes: Secondary | ICD-10-CM

## 2023-01-10 DIAGNOSIS — R197 Diarrhea, unspecified: Secondary | ICD-10-CM

## 2023-01-10 DIAGNOSIS — Z1152 Encounter for screening for COVID-19: Secondary | ICD-10-CM

## 2023-01-10 DIAGNOSIS — Z809 Family history of malignant neoplasm, unspecified: Secondary | ICD-10-CM

## 2023-01-10 DIAGNOSIS — R112 Nausea with vomiting, unspecified: Secondary | ICD-10-CM

## 2023-01-10 DIAGNOSIS — D649 Anemia, unspecified: Secondary | ICD-10-CM | POA: Diagnosis present

## 2023-01-10 DIAGNOSIS — Z79899 Other long term (current) drug therapy: Secondary | ICD-10-CM

## 2023-01-10 DIAGNOSIS — E872 Acidosis, unspecified: Secondary | ICD-10-CM | POA: Diagnosis present

## 2023-01-10 DIAGNOSIS — I959 Hypotension, unspecified: Secondary | ICD-10-CM | POA: Diagnosis not present

## 2023-01-10 DIAGNOSIS — I951 Orthostatic hypotension: Secondary | ICD-10-CM | POA: Diagnosis present

## 2023-01-10 DIAGNOSIS — E860 Dehydration: Secondary | ICD-10-CM | POA: Diagnosis present

## 2023-01-10 DIAGNOSIS — E46 Unspecified protein-calorie malnutrition: Secondary | ICD-10-CM | POA: Diagnosis present

## 2023-01-10 DIAGNOSIS — R7401 Elevation of levels of liver transaminase levels: Secondary | ICD-10-CM | POA: Diagnosis present

## 2023-01-10 DIAGNOSIS — N281 Cyst of kidney, acquired: Secondary | ICD-10-CM | POA: Diagnosis not present

## 2023-01-10 DIAGNOSIS — R0789 Other chest pain: Secondary | ICD-10-CM | POA: Diagnosis not present

## 2023-01-10 DIAGNOSIS — F3160 Bipolar disorder, current episode mixed, unspecified: Secondary | ICD-10-CM | POA: Diagnosis present

## 2023-01-10 DIAGNOSIS — F419 Anxiety disorder, unspecified: Secondary | ICD-10-CM | POA: Diagnosis present

## 2023-01-10 DIAGNOSIS — Z6824 Body mass index (BMI) 24.0-24.9, adult: Secondary | ICD-10-CM

## 2023-01-10 DIAGNOSIS — Z8249 Family history of ischemic heart disease and other diseases of the circulatory system: Secondary | ICD-10-CM

## 2023-01-10 DIAGNOSIS — R000 Tachycardia, unspecified: Secondary | ICD-10-CM | POA: Diagnosis not present

## 2023-01-10 DIAGNOSIS — Z888 Allergy status to other drugs, medicaments and biological substances status: Secondary | ICD-10-CM | POA: Diagnosis not present

## 2023-01-10 DIAGNOSIS — Z91048 Other nonmedicinal substance allergy status: Secondary | ICD-10-CM | POA: Diagnosis not present

## 2023-01-10 DIAGNOSIS — R1013 Epigastric pain: Secondary | ICD-10-CM

## 2023-01-10 DIAGNOSIS — E871 Hypo-osmolality and hyponatremia: Secondary | ICD-10-CM | POA: Diagnosis not present

## 2023-01-10 DIAGNOSIS — K909 Intestinal malabsorption, unspecified: Secondary | ICD-10-CM | POA: Diagnosis not present

## 2023-01-10 DIAGNOSIS — Z9884 Bariatric surgery status: Secondary | ICD-10-CM

## 2023-01-10 DIAGNOSIS — R142 Eructation: Secondary | ICD-10-CM

## 2023-01-10 DIAGNOSIS — R509 Fever, unspecified: Secondary | ICD-10-CM

## 2023-01-10 DIAGNOSIS — T80219A Unspecified infection due to central venous catheter, initial encounter: Principal | ICD-10-CM | POA: Diagnosis present

## 2023-01-10 DIAGNOSIS — Z789 Other specified health status: Secondary | ICD-10-CM | POA: Diagnosis present

## 2023-01-10 DIAGNOSIS — E43 Unspecified severe protein-calorie malnutrition: Secondary | ICD-10-CM | POA: Diagnosis not present

## 2023-01-10 DIAGNOSIS — R109 Unspecified abdominal pain: Secondary | ICD-10-CM | POA: Diagnosis not present

## 2023-01-10 LAB — I-STAT BETA HCG BLOOD, ED (MC, WL, AP ONLY): I-stat hCG, quantitative: <5 m[IU]/mL (ref <5)

## 2023-01-10 LAB — URINALYSIS, ROUTINE W REFLEX MICROSCOPIC
Bilirubin Urine: NEGATIVE
Glucose, UA: NEGATIVE mg/dL
Hgb urine dipstick: NEGATIVE
Ketones, ur: 5 mg/dL — AB
Leukocytes,Ua: NEGATIVE
Nitrite: NEGATIVE
Protein, ur: 100 mg/dL — AB
Specific Gravity, Urine: 1.027 (ref 1.005–1.030)
pH: 5 (ref 5.0–8.0)

## 2023-01-10 LAB — PROTIME-INR
INR: 1.3 — ABNORMAL HIGH (ref 0.8–1.2)
Prothrombin Time: 16 seconds — ABNORMAL HIGH (ref 11.4–15.2)

## 2023-01-10 LAB — CBC WITH DIFFERENTIAL/PLATELET
Abs Immature Granulocytes: 0.1 10*3/uL — ABNORMAL HIGH (ref 0.00–0.07)
Basophils Absolute: 0 10*3/uL (ref 0.0–0.1)
Basophils Relative: 1 %
Eosinophils Absolute: 0 10*3/uL (ref 0.0–0.5)
Eosinophils Relative: 0 %
HCT: 42.2 % (ref 36.0–46.0)
Hemoglobin: 14.5 g/dL (ref 12.0–15.0)
Immature Granulocytes: 1 %
Lymphocytes Relative: 9 %
Lymphs Abs: 0.7 10*3/uL (ref 0.7–4.0)
MCH: 31.9 pg (ref 26.0–34.0)
MCHC: 34.4 g/dL (ref 30.0–36.0)
MCV: 92.7 fL (ref 80.0–100.0)
Monocytes Absolute: 0.3 10*3/uL (ref 0.1–1.0)
Monocytes Relative: 3 %
Neutro Abs: 6.5 10*3/uL (ref 1.7–7.7)
Neutrophils Relative %: 86 %
Platelets: 181 10*3/uL (ref 150–400)
RBC: 4.55 MIL/uL (ref 3.87–5.11)
RDW: 14 % (ref 11.5–15.5)
WBC: 7.6 10*3/uL (ref 4.0–10.5)
nRBC: 0 % (ref 0.0–0.2)

## 2023-01-10 LAB — APTT: aPTT: 32 seconds (ref 24–36)

## 2023-01-10 LAB — RESP PANEL BY RT-PCR (RSV, FLU A&B, COVID)  RVPGX2
Influenza A by PCR: NEGATIVE
Influenza B by PCR: NEGATIVE
Resp Syncytial Virus by PCR: NEGATIVE
SARS Coronavirus 2 by RT PCR: NEGATIVE

## 2023-01-10 LAB — COMPREHENSIVE METABOLIC PANEL
ALT: 25 U/L (ref 0–44)
AST: 23 U/L (ref 15–41)
Albumin: 3.6 g/dL (ref 3.5–5.0)
Alkaline Phosphatase: 62 U/L (ref 38–126)
Anion gap: 10 (ref 5–15)
BUN: 9 mg/dL (ref 6–20)
CO2: 25 mmol/L (ref 22–32)
Calcium: 9 mg/dL (ref 8.9–10.3)
Chloride: 99 mmol/L (ref 98–111)
Creatinine, Ser: 0.77 mg/dL (ref 0.44–1.00)
GFR, Estimated: >60 mL/min (ref >60)
Glucose, Bld: 92 mg/dL (ref 70–99)
Potassium: 3.3 mmol/L — ABNORMAL LOW (ref 3.5–5.1)
Sodium: 134 mmol/L — ABNORMAL LOW (ref 135–145)
Total Bilirubin: 1.4 mg/dL — ABNORMAL HIGH (ref 0.3–1.2)
Total Protein: 6.9 g/dL (ref 6.5–8.1)

## 2023-01-10 LAB — LIPASE, BLOOD: Lipase: 201 U/L — ABNORMAL HIGH (ref 11–51)

## 2023-01-10 LAB — RAPID URINE DRUG SCREEN, HOSP PERFORMED
Amphetamines: NOT DETECTED
Barbiturates: NOT DETECTED
Benzodiazepines: NOT DETECTED
Cocaine: NOT DETECTED
Opiates: NOT DETECTED
Tetrahydrocannabinol: POSITIVE — AB

## 2023-01-10 LAB — LACTIC ACID, PLASMA
Lactic Acid, Venous: 0.8 mmol/L (ref 0.5–1.9)
Lactic Acid, Venous: 1.2 mmol/L (ref 0.5–1.9)

## 2023-01-10 MED ORDER — LACTATED RINGERS IV BOLUS (SEPSIS)
1000.0000 mL | Freq: Once | INTRAVENOUS | Status: AC
  Administered 2023-01-10: 1000 mL via INTRAVENOUS

## 2023-01-10 MED ORDER — IOHEXOL 350 MG/ML SOLN
75.0000 mL | Freq: Once | INTRAVENOUS | Status: AC | PRN
  Administered 2023-01-10: 75 mL via INTRAVENOUS

## 2023-01-10 MED ORDER — VANCOMYCIN HCL 500 MG/100ML IV SOLN
500.0000 mg | Freq: Once | INTRAVENOUS | Status: DC
  Filled 2023-01-10: qty 100

## 2023-01-10 MED ORDER — VANCOMYCIN HCL 1500 MG/300ML IV SOLN
1500.0000 mg | Freq: Once | INTRAVENOUS | Status: AC
  Administered 2023-01-11: 1500 mg via INTRAVENOUS
  Filled 2023-01-10: qty 300

## 2023-01-10 MED ORDER — SODIUM CHLORIDE 0.9% FLUSH: 10.0000 mL | INTRAVENOUS | Status: DC | PRN

## 2023-01-10 MED ORDER — MORPHINE SULFATE (PF) 4 MG/ML IV SOLN
4.0000 mg | Freq: Once | INTRAVENOUS | Status: AC
  Administered 2023-01-10: 4 mg via INTRAVENOUS
  Filled 2023-01-10: qty 1

## 2023-01-10 MED ORDER — VANCOMYCIN HCL IN DEXTROSE 1-5 GM/200ML-% IV SOLN
1000.0000 mg | Freq: Two times a day (BID) | INTRAVENOUS | Status: DC
  Administered 2023-01-11 – 2023-01-13 (×5): 1000 mg via INTRAVENOUS
  Filled 2023-01-10 (×7): qty 200

## 2023-01-10 MED ORDER — CHLORHEXIDINE GLUCONATE CLOTH 2 % EX PADS: 6.0000 | MEDICATED_PAD | Freq: Every day | CUTANEOUS | Status: DC

## 2023-01-10 MED ORDER — SODIUM CHLORIDE 0.9 % IV SOLN
2.0000 g | Freq: Three times a day (TID) | INTRAVENOUS | Status: AC
  Administered 2023-01-11 – 2023-01-16 (×17): 2 g via INTRAVENOUS
  Filled 2023-01-10 (×17): qty 12.5

## 2023-01-10 MED ORDER — SODIUM CHLORIDE 0.9 % IV SOLN
2.0000 g | Freq: Once | INTRAVENOUS | Status: AC
  Administered 2023-01-10: 2 g via INTRAVENOUS
  Filled 2023-01-10: qty 12.5

## 2023-01-10 MED ORDER — METRONIDAZOLE 500 MG/100ML IV SOLN
500.0000 mg | Freq: Once | INTRAVENOUS | Status: AC
  Administered 2023-01-10: 500 mg via INTRAVENOUS
  Filled 2023-01-10: qty 100

## 2023-01-10 MED ORDER — LACTATED RINGERS IV SOLN: INTRAVENOUS | Status: DC

## 2023-01-10 MED ORDER — LORAZEPAM 2 MG/ML IJ SOLN
0.5000 mg | Freq: Once | INTRAMUSCULAR | Status: AC
  Administered 2023-01-10: 0.5 mg via INTRAVENOUS
  Filled 2023-01-10: qty 1

## 2023-01-10 MED ORDER — LACTATED RINGERS IV BOLUS (SEPSIS)
250.0000 mL | Freq: Once | INTRAVENOUS | Status: AC
  Administered 2023-01-11: 250 mL via INTRAVENOUS

## 2023-01-10 MED ORDER — VANCOMYCIN HCL IN DEXTROSE 1-5 GM/200ML-% IV SOLN: 1000.0000 mg | Freq: Once | INTRAVENOUS | Status: DC

## 2023-01-10 NOTE — Sepsis Progress Note (Signed)
Elink following code sepsis

## 2023-01-10 NOTE — Telephone Encounter (Signed)
[facility] - Client TELEPHONE ADVICE RECORD [facility] Patient Name: Victoria Andrews Gender: Female DOB: 04/23/1996 Age: 26 Y 3 M 23 D Return Phone Number: [phone] (Primary) Address: City/ State/ Zip: [place] [address] Client [facility] - Client Client Site [facility] - Day Provider AA - PHYSICIAN, NOT LISTED- MD Contact Type Call Who Is Calling Patient / Member / Family / Caregiver Call Type Triage / Clinical Relationship To Patient Self Return Phone Number [phone] (Primary) Chief Complaint Flank Pain Reason for Call Symptomatic / Request for Victoria states she is from the office. The patient had a virtual visit earlier. She has a fever of 101.3, flank pain, and hasn't been able to digest food in the last month and a half. She sees NP Eugenia Pancoast. Translation No Nurse Assessment Nurse: Mariel Sleet, RN, Erasmo Downer Date/Time (Eastern Time): 01/10/2023 3:12:25 PM Confirm and document reason for call. If symptomatic, describe symptoms. ---Caller states that she has a fever and some side pain since this morning, up to 101.3. Does the patient have any new or worsening symptoms? ---Yes Will a triage be completed? ---Yes Related visit to physician within the last 2 weeks? ---Yes Does the PT have any chronic conditions? (i.e. diabetes, asthma, this includes High risk factors for pregnancy, etc.) ---No Is the patient pregnant or possibly pregnant? (Ask all females between the ages of 96-55) ---No Is this a behavioral health or substance abuse call? ---No Guidelines Guideline Title Affirmed Question Affirmed Notes Nurse Date/Time Eilene Ghazi Time) Abdominal Pain - Female [1] MILDMODERATE pain AND [2] comes and goes (cramps) Mariel Sleet, RN, Erasmo Downer 01/10/2023 3:14:07 PM Disp. Time Eilene Ghazi Time) Disposition Final User 01/10/2023 3:17:22 PM Home Care Yes  Mariel Sleet, RN, Erasmo Downer PLEASE NOTE: All timestamps contained within this report are represented as [place] Standard Time. CONFIDENTIALTY NOTICE: This fax transmission is intended only for the addressee. It contains information that is legally privileged, confidential or otherwise protected from use or disclosure. If you are not the intended recipient, you are strictly prohibited from reviewing, disclosing, copying using or disseminating any of this information or taking any action in reliance on or regarding this information. If you have received this fax in error, please notify us immediately by telephone so that we can arrange for its return to [place]. Phone: [phone], Toll-Free: [phone], Fax: [phone] Page: 2 of 2 Call Id: [id number] Final Disposition 01/10/2023 3:17:22 Mason, RN, Laurin Coder Disagree/Comply Comply Caller Understands Yes PreDisposition Call Doctor Care Advice Given Per Guideline HOME CARE: * You should be able to treat this at home. REASSURANCE AND EDUCATION - STOMACH PAIN: * It doesn't sound like a serious stomachache. * You become worse CALL BACK IF: * Intermittent pain (e.g., comes and goes, cramps) lasts over 48 hours CARE ADVICE given per Abdominal Pain - Female (Adult) guideline

## 2023-01-10 NOTE — ED Provider Notes (Signed)
[facility] EMERGENCY DEPARTMENT Provider Note   CSN: [id number] Arrival date & time: 01/10/23  1728     History {Add pertinent medical, surgical, social history, OB history to HPI:1} No chief complaint on file.   Victoria Andrews is a 27 y.o. female.  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  Presents emergency department today with concerns for dehydration and with a fever.  Over the past day or so has felt generalized malaise and has developed right flank pain with a fever.  Temperature 101 at home earlier today.  Additionally has myalgias and a headache.  Has persistent nausea and vomiting, has had multiple admissions over the past couple of months, actually has a PICC line in place for TPN at home.  Has not been tolerating her PICC line feeds because she continues to have vomiting.  Over the past couple days has felt more lightheaded.  Has not noted any swelling or drainage or abnormal skin findings around her PICC line.  Her urine yesterday did have a red tinge to it.  She has had kidney stones in the past, however this feels different.  HPI     Home Medications Prior to Admission medications   Medication Sig Start Date End Date Taking? Authorizing Provider  clonazePAM (KLONOPIN) 0.5 MG tablet Take by mouth. 01/02/23   [provider]  dronabinol (MARINOL) 5 MG capsule Take by mouth. 01/09/23   [provider]  escitalopram (LEXAPRO) 10 MG tablet Take by mouth.    [provider]  famotidine (PEPCID) 20 MG tablet Take by mouth.    [provider]  loperamide (IMODIUM) 2 MG capsule Take by mouth.    [provider]  mirtazapine (REMERON) 15 MG tablet Take 15 mg by mouth daily. 11/25/22   [provider]  promethazine (PHENERGAN) 25 MG tablet Take by mouth. 01/06/23 02/08/23  [provider]  scopolamine (TRANSDERM-SCOP) 1 MG/3DAYS Place onto the skin. 11/28/22 01/03/24  [provider]  traZODone (DESYREL) 50 MG tablet Take 1 tablet by mouth at bedtime. 07/08/22 01/31/23  [provider]      Allergies    Ciprofloxacin, Ondansetron, and Other    Review of Systems   Review of Systems  Physical Exam Updated Vital Signs BP (!) 85/67 (BP Location: Left Arm)   Pulse (!) 117   Temp 98.6 F (37 C)   Resp 17   Ht 5\' 5"  (1.651 m)   Wt 68 kg   LMP 12/26/2022   SpO2 97%   BMI 24.96 kg/m  Physical Exam Vitals and nursing note reviewed.  Constitutional:      General: She is not in acute distress.    Appearance: She is well-developed. She is not ill-appearing or diaphoretic.  HENT:     Head: Normocephalic and atraumatic.     Right Ear: External ear normal.     Left Ear: External ear normal.     Mouth/Throat:     Mouth: Mucous membranes are moist.     Pharynx: Oropharynx is clear.  Eyes:     Conjunctiva/sclera: Conjunctivae normal.  Cardiovascular:     Rate and Rhythm: Regular rhythm. Tachycardia present.     Pulses: Normal pulses.     Heart sounds: Normal heart sounds. No murmur heard. Pulmonary:     Effort: Pulmonary effort is normal. No respiratory distress.     Breath sounds: Normal breath  sounds. No stridor. No wheezing, rhonchi or rales.  Abdominal:     General: Abdomen is flat.     Palpations: Abdomen is soft.     Tenderness: There is no abdominal tenderness. There is right CVA tenderness. There is no left CVA tenderness, guarding or rebound.  Musculoskeletal:        General: No swelling.     Cervical back: Neck supple.     Right lower leg: No edema.     Left lower leg: No edema.  Skin:    General: Skin is warm and dry.     Capillary Refill: Capillary refill takes less than 2 seconds.  Neurological:     General: No focal deficit present.     Mental Status: She is alert and oriented to person, place, and time.  Psychiatric:         Mood and Affect: Mood normal.     ED Results / Procedures / Treatments   Labs (all labs ordered are listed, but only abnormal results are displayed) Labs Reviewed  RESP PANEL BY RT-PCR (RSV, FLU A&B, COVID)  RVPGX2  CULTURE, BLOOD (ROUTINE X 2)  CULTURE, BLOOD (ROUTINE X 2)  URINE CULTURE  LACTIC ACID, PLASMA  LACTIC ACID, PLASMA  COMPREHENSIVE METABOLIC PANEL  CBC WITH DIFFERENTIAL/PLATELET  PROTIME-INR  APTT  URINALYSIS, ROUTINE W REFLEX MICROSCOPIC  I-STAT BETA HCG BLOOD, ED ([facility], [facility], [facility] ONLY)    EKG None  Radiology No results found.  Procedures Procedures  {Document cardiac monitor, telemetry assessment procedure when appropriate:1}  Medications Ordered in ED Medications  lactated ringers infusion (has no administration in time range)  lactated ringers bolus 1,000 mL (has no administration in time range)    And  lactated ringers bolus 1,000 mL (has no administration in time range)    And  lactated ringers bolus 250 mL (has no administration in time range)  ceFEPIme (MAXIPIME) 2 g in sodium chloride 0.9 % 100 mL IVPB (has no administration in time range)  metroNIDAZOLE (FLAGYL) IVPB 500 mg (has no administration in time range)  ceFEPIme (MAXIPIME) 2 g in sodium chloride 0.9 % 100 mL IVPB (has no administration in time range)  vancomycin (VANCOREADY) IVPB 1500 mg/300 mL (has no administration in time range)    ED Course/ Medical Decision Making/ A&P   {   Click here for ABCD2, HEART and other calculatorsREFRESH Note before signing :1}                          Medical Decision Making Amount and/or Complexity of Data Reviewed Labs: ordered. ECG/medicine tests: ordered.  Risk Prescription drug management.   Pt is a 27 y.o. 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   SIRS criteria met by abnormalities including fever at home plus hypotension and tachycardia here, initial blood pressure here was 85/67, blood pressure on rooming is 100 systolic. Suspected infection, source bloodstream versus urinary. Code Sepsis was called immediately after vital sign / lab abnormalities c/w SIRS with suspected source of infection. Labs, cultures, IVFs, antibiotics ordered once Code Sepsis called.  Labs performed and resulted above. Pertinent lab findings include ***. Imaging abnormalities include ***. Antibiotics were started on this patient: Vancomycin, cefepime, metronidazole. Blood cultures x2 were drawn prior to infusion of antibiotics.   Medicine contacted for evaluation and triage  of the patient to the hospital. Labs and imaging reviewed by myself and considered in medical decision making if ordered.  Imaging interpreted by radiology.  Patient admitted to *** in stable condition. The plan for this patient was discussed with Dr. Ashok Cordia, who voiced agreement and who oversaw evaluation and treatment of this patient.     {Document critical care time when appropriate:1} {Document review of labs and clinical decision tools ie heart score, Chads2Vasc2 etc:1}  {Document your independent review of radiology images, and any outside records:1} {Document your discussion with family members, caretakers, and with consultants:1} {Document social determinants of health affecting pt's care:1} {Document your decision making why or why not admission, treatments were needed:1} Final Clinical Impression(s) / ED Diagnoses Final diagnoses:  None    Rx / DC Orders ED Discharge Orders     None

## 2023-01-10 NOTE — Progress Notes (Signed)
Pharmacy Antibiotic Note  Charlottie Peragine is a 27 y.o. female admitted on 01/10/2023 with sepsis.  Patient admitted to ED with fever and flank pain. Patient with central line in place for TPN. Has history of gastric sleeve 6 years ago. Pharmacy has been consulted for vancomycin and cefepime dosing.  Plan: Start cefepime 2g IV q8h Give vanc loading 1500 mg x1 Start vanc maintenance 1000 mg IV q12h  Obtain vancomycin peak and trough levels for goal AUC 400-550  De-escalate as indicated and f/u with length of therapy   Height: 5\' 5"  (165.1 cm) Weight: 68 kg (150 lb) IBW/kg (Calculated) : 57  Temp (24hrs), Avg:98.6 F (37 C), Min:98.5 F (36.9 C), Max:98.6 F (37 C)  No results for input(s): "WBC", "CREATININE", "LATICACIDVEN", "VANCOTROUGH", "VANCOPEAK", "VANCORANDOM", "GENTTROUGH", "GENTPEAK", "GENTRANDOM", "TOBRATROUGH", "TOBRAPEAK", "TOBRARND", "AMIKACINPEAK", "AMIKACINTROU", "AMIKACIN" in the last 168 hours.  CrCl cannot be calculated (Patient's most recent lab result is older than the maximum 21 days allowed.).    Allergies  Allergen Reactions   Ciprofloxacin Nausea And Vomiting   Ondansetron Nausea And Vomiting   Other Hives    Pt allergic to dermabond    Antimicrobials this admission: Vanc 1/12 >>  Cefepime 1/12 >>   Dose adjustments this admission: N/a   Microbiology results: 1/12 BCx: sent 1/12 UCx: sent  1/12 Resp panel: sent      Thank you for allowing pharmacy to be a part of this patient's care.  Gena Fray, PharmD PGY1 Pharmacy Resident   01/10/2023 8:07 PM

## 2023-01-10 NOTE — Telephone Encounter (Signed)
[facility] Night - Client Nonclinical Telephone Record  [facility] Client [facility] Night - Client Client Site [facility] Provider AA - PHYSICIAN, NOT LISTED- MD Contact Type Call Who Is Calling Patient / Member / Family / Caregiver Caller Name Dorado Phone Number [phone] Patient Name Victoria Andrews Patient DOB 03/04/1996 Call Type Message Only Information Provided Reason for Call Request for General Office Information Initial Comment Caller states that she is trying to log in to her virtual appointment with Tabitha on my chart, but she is having trouble. Declined triage. Disp. Time Disposition Final User 01/10/2023 8:02:45 AM General Information Provided Yes Windy Canny Call Closed By: Windy Canny Transaction Date/Time: 01/10/2023 7:59:58 AM (ET   Sending note to Red Christians FNP,[facility] Joellen CMA.

## 2023-01-10 NOTE — Progress Notes (Signed)
MyChart Video Visit    Virtual Visit via Video Note   This visit type was conducted due to national recommendations for restrictions regarding the COVID-19 Pandemic (e.g. social distancing) in an effort to limit this patient's exposure and mitigate transmission in our community. This patient is at least at moderate risk for complications without adequate follow up. This format is felt to be most appropriate for this patient at this time. Physical exam was limited by quality of the video and audio technology used for the visit. CMA was able to get the patient set up on a video visit.  Patient location: Home. Patient and provider in visit Provider location: Office  I discussed the limitations of evaluation and management by telemedicine and the availability of in person appointments. The patient expressed understanding and agreed to proceed.  Visit Date: 01/10/2023  Today's healthcare provider: Mort Sawyers, FNP     Subjective:    Patient ID: Victoria Andrews, female    DOB: Nov 12, 1996, 27 y.o.   MRN: [id number]  Chief Complaint  Patient presents with   New Patient (Initial Visit)   Nausea    Started in November has been in hospital 3 times for this.     HPI  Pt here today via video visit as a new patient visit.   S/p repair paraesophageal hernia including fundoplasty w/o mesh and redo hiatal hernia repair, which was performed 11/11/22. She does also have a h/o cholecystectomy. Since the hernia repair about three weeks prior started with increasing n/v and only tolerating small sips water. She had been previously dx with canabinoid hyperemesis syndrome during visit to [facility] 6 days prior, and quit smoking marijuana after that visit.    EKG was with no evidence arrhythmia or ischemia   Qtc within normal limits   Negative pregnancy had mild elevation lipase likely   From recurrent vomiting. Mild elevation bilirubin also noted lfts normal otherwise.    CT abd pelvis,  unremarkable outside of ovarian cyst.   D dimer slightly elevated   CTA chest unremarkable  Burps often as well when she is dry heaving.   Ongoing diarrhea with loose stool, takes immodium for this when able.  She states ongoing concerns with nausea, and with eating food she will throw up often. She has decreased appetite, and not able to drink a lot of fluids either. She is currently on TPN (unique formula- 12 hour bag) but this also makes her feel pretty terrible and throw up. She throws up or has the gagging sensation about 12 times daily.   Relieving factors: taking baths every few hours just to   Provide her with some temporary relief.   Anxiety state: klonipin prn when phenergan is not working, and this will help her with nausea. Lexapro 10 mg and also remeron, however can't take daily because hard to keep anything down.   Heart burn trying to keep down pepcid once daily.   Bariatric doctor Endoscopy, blood tests, was tested for parasites which they were told was negative.  Bariatric surgery back in 2016.   Currently with right picc line, has weekly visits from health nurse to clean and flush.  ER visit 12/29 CT head wo contrast, h/o subdural fluid , no acute cranial pathology.   12/18/22: upper endoscopy, normal esophagus, bilious gastric fluid.   Has been running a low grade fever as well on and off over the last month or so. Highest it gets is 101F, last fever she thinks were  this am.   Has f/u with Dr. Hervey Ard in one week (bariatric surgeon) on 01/21/23.  Past Medical History:  Diagnosis Date   Hypotension, postural    Kidney stones    Pancreatitis    Renal disorder    acute kidney failure    Past Surgical History:  Procedure Laterality Date   BREAST SURGERY     Breast lift   HERNIA REPAIR     hiatal hernia   LAPAROSCOPIC GASTRIC SLEEVE RESECTION     Tummy Tuck      Family History  Problem Relation Age of Onset   Atrial fibrillation Mother    Muscular  dystrophy Maternal Grandmother    Congestive Heart Failure Maternal Grandfather    Cancer Paternal Grandmother        unknown   Prostate cancer Paternal Grandfather    Diverticulosis Paternal Uncle     Social History   Socioeconomic History   Marital status: Single    Spouse name: Not on file   Number of children: Not on file   Years of education: Not on file   Highest education level: Not on file  Occupational History   Not on file  Tobacco Use   Smoking status: Never   Smokeless tobacco: Never  Vaping Use   Vaping Use: Former  Substance and Sexual Activity   Alcohol use: Not Currently    Comment: socially   Drug use: Never   Sexual activity: Not on file  Other Topics Concern   Not on file  Social History Narrative   Not on file   Social Determinants of Health   Financial Resource Strain: Not on file  Food Insecurity: Not on file  Transportation Needs: Not on file  Physical Activity: Not on file  Stress: Not on file  Social Connections: Not on file  Intimate Partner Violence: Not on file    No facility-administered medications prior to visit.   Outpatient Medications Prior to Visit  Medication Sig Dispense Refill   clonazePAM (KLONOPIN) 0.5 MG tablet Take 1 mg by mouth daily as needed for anxiety.     dronabinol (MARINOL) 5 MG capsule Take 5 mg by mouth 2 (two) times daily.     escitalopram (LEXAPRO) 10 MG tablet Take 10 mg by mouth daily.     famotidine (PEPCID) 20 MG tablet Take 20 mg by mouth daily as needed for heartburn or indigestion.     loperamide (IMODIUM) 2 MG capsule Take 2 mg by mouth as needed for diarrhea or loose stools.     mirtazapine (REMERON) 15 MG tablet Take 15 mg by mouth daily.     promethazine (PHENERGAN) 25 MG tablet Take 25 mg by mouth every 4 (four) hours as needed for nausea or vomiting.     scopolamine (TRANSDERM-SCOP) 1 MG/3DAYS Place 1 patch onto the skin every 3 (three) days.     traZODone (DESYREL) 50 MG tablet Take 1 tablet by  mouth at bedtime.     esomeprazole (NEXIUM) 40 MG capsule Take 40 mg by mouth 2 (two) times daily before a meal.     HYDROcodone-acetaminophen (NORCO) 5-325 MG tablet Take 1 tablet by mouth every 6 (six) hours as needed for severe pain. 6 tablet 0   medroxyPROGESTERone (DEPO-PROVERA) 150 MG/ML injection Inject 150 mg into the muscle every 3 (three) months.     metoCLOPramide (REGLAN) 10 MG tablet Take 1 tablet (10 mg total) by mouth every 8 (eight) hours as needed for nausea or vomiting. [place]  tablet 0   naproxen (NAPROSYN) 375 MG tablet Take 1 tablet (375 mg total) by mouth 2 (two) times daily. 20 tablet 0   ondansetron (ZOFRAN-ODT) 4 MG disintegrating tablet Take 1 tablet (4 mg total) by mouth every 8 (eight) hours as needed for nausea or vomiting. 12 tablet 0   temazepam (RESTORIL) 7.5 MG capsule Take 7.5 mg by mouth at bedtime as needed for sleep.      Allergies  Allergen Reactions   Ciprofloxacin Nausea And Vomiting   Ondansetron Nausea And Vomiting   Other Hives    Pt allergic to dermabond    Review of Systems    ROS: Pertinent symptoms negative unless otherwise noted in HPI   Objective:    Physical Exam Constitutional:      General: She is not in acute distress.    Appearance: Normal appearance. She is not ill-appearing or toxic-appearing.  Pulmonary:     Effort: Pulmonary effort is normal.  Neurological:     General: No focal deficit present.     Mental Status: She is alert and oriented to person, place, and time. Mental status is at baseline.  Psychiatric:        Attention and Perception: Attention and perception normal.        Mood and Affect: Mood normal. Affect is flat.        Behavior: Behavior normal. Behavior is cooperative.        Thought Content: Thought content normal.        Cognition and Memory: Cognition and memory normal.        Judgment: Judgment normal.     BP 102/69   Pulse (!) 102   Temp 98.5 F (36.9 C)   Ht 5\' 5"  (1.651 m)   Wt 152 lb (68.9  kg)   LMP 12/26/2022   BMI 25.29 kg/m  Wt Readings from Last 3 Encounters:  01/10/23 150 lb (68 kg)  01/10/23 152 lb (68.9 kg)  12/03/22 145 lb 1 oz (65.8 kg)       Assessment & Plan:   Bipolar 1 disorder, mixed (HCC) Assessment & Plan: Discussed with patient that she will need a psychiatrist if not already seeing one to manage bipolar with medications Patient verbalized acknowledgment   Cyclic vomiting syndrome Assessment & Plan: Various notes reviewed in epic Patient continuing ongoing follow-up with surgeon We will evaluate need for referral to infectious disease and/or GI   S/P laparoscopic sleeve gastrectomy  Diarrhea due to malabsorption Assessment & Plan:  Ordering stool culture workup  Orders: -     Celiac Pnl 2 rflx Endomysial Ab Ttr; Future -     CALPROTECTIN; Future -     Giardia antigen; Future -     C. difficile GDH and Toxin A/B; Future -     Gastrointestinal Pathogen Pnl RT, PCR; Future -     CBC with Differential/Platelet; Future  Elevated lipase Assessment & Plan: The of lipase and amylase.  Pending results  Orders: -     Lipase; Future -     Amylase; Future -     CBC with Differential/Platelet; Future  Elevated LFTs -     Hepatitis panel, acute; Future  Burping -     Alpha-Gal Panel; Future -     H. pylori antibody, IgG; Future  Epigastric pain Assessment & Plan: Ordering H. pylori to rule this out  Orders: -     CBC with Differential/Platelet; Future  Intractable nausea and vomiting Assessment &  Plan:  Will attempt to obtain records referral for GI being considered as well as infectious disease       I have discontinued Vela Tedeschi's medroxyPROGESTERone, esomeprazole, temazepam, naproxen, HYDROcodone-acetaminophen, ondansetron, and metoCLOPramide. I am also having her maintain her escitalopram, mirtazapine, promethazine, dronabinol, traZODone, famotidine, clonazePAM, loperamide, and scopolamine.  No orders of the  defined types were placed in this encounter.   I discussed the assessment and treatment plan with the patient. The patient was provided an opportunity to ask questions and all were answered. The patient agreed with the plan and demonstrated an understanding of the instructions.   The patient was advised to call back or seek an in-person evaluation if the symptoms worsen or if the condition fails to improve as anticipated.  I provided 15 minutes of face-to-face time during this encounter.   Eugenia Pancoast, [place] [facility] [place] [phone] (phone) [phone] (fax)  [place]

## 2023-01-10 NOTE — ED Triage Notes (Signed)
Pt c/o fever, r flank pain, HA, N/V, lightheadedness x few days; central line in place for TPN; intermittent hospitalizations since November for nausea, decreased appetite; pt endorses pain at central line around site; no swelling, drainage, some slight erythema  noted; hx gastric sleeve surgery 6 years ago;

## 2023-01-10 NOTE — Telephone Encounter (Signed)
Mom called in to give Tabitha the fax number where to send the labs: Dr Tobin Chad fax number is [phone] Attn:Bariatric Nurse

## 2023-01-10 NOTE — Telephone Encounter (Signed)
Agree with precautions given to pt  Agree with nurse assessment in plan.  Thank you for speaking with them. 

## 2023-01-10 NOTE — Telephone Encounter (Signed)
I spoke with pt and she said since her VV with Eugenia Pancoast FNP earlier today pt has developed rt side pain at waistline that is a sharp pain which is consistent and now pain level of 4. Pt said pain started at 1:42 pm when pt woke up and pain was there. No N&V or diarrhea today. Pt taking phenergan so will not have nausea. Pt said the pain in rt side is new and pt is having pain now. Pt has H/A that the entire head hurts.no cough, S/T, head congestion or earache. Pt does have appendix but pain is localized at rt side of waist. Pt said she has not eaten in weeks; pt said she has had nothing to eat today but has been drinking juice, water and gatorade. Pt urinated earlier but was very small amt and dark yellow. Pt said she will have her parents take her to [facility] ED for eval now. I had to assist a walk in pt and  just got this note completed and did not see where pt had gone to ED. I called and spoke with pt and she said she was on her way now to [facility] ED. Sending note to Red Christians FNP.

## 2023-01-10 NOTE — Telephone Encounter (Signed)
Saw patient already, thank you for speaking with the patient.  Please see note for further information.  

## 2023-01-10 NOTE — ED Provider Triage Note (Signed)
Emergency Medicine Provider Triage Evaluation Note  Victoria Andrews , a 27 y.o. female  was evaluated in triage.  Pt complains of right flank pain, headache, nausea, vomiting, lightheadedness, cannot tolerate TPN, fluids, she has been intermittently hospitalized since November for nausea, she reports some mild pain around her central line site, denies dysuria, she reports no swelling, drainage noted around her central line site.  She had a history of gastric sleeve surgery 6 years ago.  She was febrile to 101.3 prior to arrival, was given extra strength Tylenol, she was hypotensive, felt like she was going to pass out in triage, blood pressure 85/67, tachycardic to 117.  Review of Systems  Positive: Flank pain, headache, nausea, vomiting, lightheadedness Negative: Chest pain  Physical Exam  BP (!) 85/67 (BP Location: Left Arm)   Pulse (!) 117   Temp 98.6 F (37 C)   Resp 17   Ht 5\' 5"  (1.651 m)   Wt 68 kg   LMP 12/26/2022   SpO2 97%   BMI 24.96 kg/m  Gen:   Awake, ill appearing Resp:  Normal effort  MSK:   Moves extremities without difficulty  Other:  Tachycardic, normal rhythm  Medical Decision Making  Medically screening exam initiated at 6:35 PM.  Appropriate orders placed.  Victoria Andrews was informed that the remainder of the evaluation will be completed by another provider, this initial triage assessment does not replace that evaluation, and the importance of remaining in the ED until their evaluation is complete.  Charge nurse informed of code sepsis activation   Dorien Chihuahua 01/10/23 1835

## 2023-01-11 ENCOUNTER — Inpatient Hospital Stay (HOSPITAL_COMMUNITY): Payer: BC Managed Care – PPO

## 2023-01-11 DIAGNOSIS — Z789 Other specified health status: Secondary | ICD-10-CM | POA: Diagnosis not present

## 2023-01-11 DIAGNOSIS — Z809 Family history of malignant neoplasm, unspecified: Secondary | ICD-10-CM | POA: Diagnosis not present

## 2023-01-11 DIAGNOSIS — F3160 Bipolar disorder, current episode mixed, unspecified: Secondary | ICD-10-CM | POA: Diagnosis present

## 2023-01-11 DIAGNOSIS — E871 Hypo-osmolality and hyponatremia: Secondary | ICD-10-CM | POA: Diagnosis present

## 2023-01-11 DIAGNOSIS — Y848 Other medical procedures as the cause of abnormal reaction of the patient, or of later complication, without mention of misadventure at the time of the procedure: Secondary | ICD-10-CM | POA: Diagnosis present

## 2023-01-11 DIAGNOSIS — A419 Sepsis, unspecified organism: Secondary | ICD-10-CM

## 2023-01-11 DIAGNOSIS — I951 Orthostatic hypotension: Secondary | ICD-10-CM | POA: Diagnosis present

## 2023-01-11 DIAGNOSIS — R1115 Cyclical vomiting syndrome unrelated to migraine: Secondary | ICD-10-CM | POA: Diagnosis not present

## 2023-01-11 DIAGNOSIS — T80219A Unspecified infection due to central venous catheter, initial encounter: Secondary | ICD-10-CM | POA: Diagnosis present

## 2023-01-11 DIAGNOSIS — E43 Unspecified severe protein-calorie malnutrition: Secondary | ICD-10-CM | POA: Diagnosis present

## 2023-01-11 DIAGNOSIS — Z79899 Other long term (current) drug therapy: Secondary | ICD-10-CM | POA: Diagnosis not present

## 2023-01-11 DIAGNOSIS — R6521 Severe sepsis with septic shock: Secondary | ICD-10-CM | POA: Diagnosis present

## 2023-01-11 DIAGNOSIS — R112 Nausea with vomiting, unspecified: Secondary | ICD-10-CM | POA: Diagnosis not present

## 2023-01-11 DIAGNOSIS — Z888 Allergy status to other drugs, medicaments and biological substances status: Secondary | ICD-10-CM | POA: Diagnosis not present

## 2023-01-11 DIAGNOSIS — R197 Diarrhea, unspecified: Secondary | ICD-10-CM | POA: Diagnosis present

## 2023-01-11 DIAGNOSIS — E46 Unspecified protein-calorie malnutrition: Secondary | ICD-10-CM | POA: Diagnosis not present

## 2023-01-11 DIAGNOSIS — D696 Thrombocytopenia, unspecified: Secondary | ICD-10-CM | POA: Diagnosis present

## 2023-01-11 DIAGNOSIS — I959 Hypotension, unspecified: Secondary | ICD-10-CM | POA: Diagnosis not present

## 2023-01-11 DIAGNOSIS — Z881 Allergy status to other antibiotic agents status: Secondary | ICD-10-CM | POA: Diagnosis not present

## 2023-01-11 DIAGNOSIS — Z6824 Body mass index (BMI) 24.0-24.9, adult: Secondary | ICD-10-CM | POA: Diagnosis not present

## 2023-01-11 DIAGNOSIS — E872 Acidosis, unspecified: Secondary | ICD-10-CM | POA: Diagnosis present

## 2023-01-11 DIAGNOSIS — R509 Fever, unspecified: Secondary | ICD-10-CM | POA: Diagnosis present

## 2023-01-11 DIAGNOSIS — Z91048 Other nonmedicinal substance allergy status: Secondary | ICD-10-CM | POA: Diagnosis not present

## 2023-01-11 DIAGNOSIS — E559 Vitamin D deficiency, unspecified: Secondary | ICD-10-CM | POA: Diagnosis present

## 2023-01-11 DIAGNOSIS — R578 Other shock: Secondary | ICD-10-CM | POA: Diagnosis not present

## 2023-01-11 DIAGNOSIS — Z9884 Bariatric surgery status: Secondary | ICD-10-CM | POA: Diagnosis not present

## 2023-01-11 DIAGNOSIS — Z8249 Family history of ischemic heart disease and other diseases of the circulatory system: Secondary | ICD-10-CM | POA: Diagnosis not present

## 2023-01-11 DIAGNOSIS — F419 Anxiety disorder, unspecified: Secondary | ICD-10-CM | POA: Diagnosis present

## 2023-01-11 DIAGNOSIS — E876 Hypokalemia: Secondary | ICD-10-CM | POA: Diagnosis present

## 2023-01-11 DIAGNOSIS — D649 Anemia, unspecified: Secondary | ICD-10-CM | POA: Diagnosis present

## 2023-01-11 DIAGNOSIS — Z1152 Encounter for screening for COVID-19: Secondary | ICD-10-CM | POA: Diagnosis not present

## 2023-01-11 DIAGNOSIS — E860 Dehydration: Secondary | ICD-10-CM | POA: Diagnosis present

## 2023-01-11 LAB — CBC
HCT: 23 % — ABNORMAL LOW (ref 36.0–46.0)
HCT: 29.6 % — ABNORMAL LOW (ref 36.0–46.0)
Hemoglobin: 8.3 g/dL — ABNORMAL LOW (ref 12.0–15.0)
Hemoglobin: 10 g/dL — ABNORMAL LOW (ref 12.0–15.0)
MCH: 32.9 pg (ref 26.0–34.0)
MCH: 31.2 pg (ref 26.0–34.0)
MCHC: 36.1 g/dL — ABNORMAL HIGH (ref 30.0–36.0)
MCHC: 33.8 g/dL (ref 30.0–36.0)
MCV: 91.3 fL (ref 80.0–100.0)
MCV: 92.2 fL (ref 80.0–100.0)
Platelets: 78 10*3/uL — ABNORMAL LOW (ref 150–400)
Platelets: 103 10*3/uL — ABNORMAL LOW (ref 150–400)
RBC: 2.52 MIL/uL — ABNORMAL LOW (ref 3.87–5.11)
RBC: 3.21 MIL/uL — ABNORMAL LOW (ref 3.87–5.11)
RDW: 13.8 % (ref 11.5–15.5)
RDW: 14 % (ref 11.5–15.5)
WBC: 3.4 10*3/uL — ABNORMAL LOW (ref 4.0–10.5)
WBC: 4 10*3/uL (ref 4.0–10.5)
nRBC: 0 % (ref 0.0–0.2)
nRBC: 0 % (ref 0.0–0.2)

## 2023-01-11 LAB — DIC (DISSEMINATED INTRAVASCULAR COAGULATION)PANEL
D-Dimer, Quant: 7.99 ug/mL-FEU — ABNORMAL HIGH (ref 0.00–0.50)
Fibrinogen: 316 mg/dL (ref 210–475)
INR: 1.6 — ABNORMAL HIGH (ref 0.8–1.2)
Platelets: 120 10*3/uL — ABNORMAL LOW (ref 150–400)
Prothrombin Time: 18.9 seconds — ABNORMAL HIGH (ref 11.4–15.2)
Smear Review: NONE SEEN
aPTT: 32 seconds (ref 24–36)

## 2023-01-11 LAB — COMPREHENSIVE METABOLIC PANEL
ALT: 19 U/L (ref 0–44)
AST: 21 U/L (ref 15–41)
Albumin: 1.7 g/dL — ABNORMAL LOW (ref 3.5–5.0)
Alkaline Phosphatase: 31 U/L — ABNORMAL LOW (ref 38–126)
Anion gap: 11 (ref 5–15)
BUN: 6 mg/dL (ref 6–20)
CO2: 18 mmol/L — ABNORMAL LOW (ref 22–32)
Calcium: 7.1 mg/dL — ABNORMAL LOW (ref 8.9–10.3)
Chloride: 102 mmol/L (ref 98–111)
Creatinine, Ser: 0.5 mg/dL (ref 0.44–1.00)
GFR, Estimated: >60 mL/min (ref >60)
Glucose, Bld: 75 mg/dL (ref 70–99)
Potassium: 3.5 mmol/L (ref 3.5–5.1)
Sodium: 131 mmol/L — ABNORMAL LOW (ref 135–145)
Total Bilirubin: 0.9 mg/dL (ref 0.3–1.2)
Total Protein: 3.2 g/dL — ABNORMAL LOW (ref 6.5–8.1)

## 2023-01-11 LAB — PROCALCITONIN: Procalcitonin: 3.05 ng/mL

## 2023-01-11 LAB — ECHOCARDIOGRAM LIMITED
Height: 65 in
S' Lateral: 2.9 cm
Weight: 2400 oz

## 2023-01-11 LAB — CK: Total CK: 224 U/L (ref 38–234)

## 2023-01-11 LAB — MRSA NEXT GEN BY PCR, NASAL: MRSA by PCR Next Gen: NOT DETECTED

## 2023-01-11 LAB — GLUCOSE, CAPILLARY: Glucose-Capillary: 84 mg/dL (ref 70–99)

## 2023-01-11 LAB — LACTIC ACID, PLASMA
Lactic Acid, Venous: 5.6 mmol/L (ref 0.5–1.9)
Lactic Acid, Venous: 1.2 mmol/L (ref 0.5–1.9)

## 2023-01-11 LAB — CORTISOL: Cortisol, Plasma: 5.6 ug/dL

## 2023-01-11 LAB — ACETAMINOPHEN LEVEL: Acetaminophen (Tylenol), Serum: <10 ug/mL — ABNORMAL LOW (ref 10–30)

## 2023-01-11 LAB — SALICYLATE LEVEL: Salicylate Lvl: <7 mg/dL — ABNORMAL LOW (ref 7.0–30.0)

## 2023-01-11 MED ORDER — DRONABINOL 5 MG PO CAPS
5.0000 mg | ORAL_CAPSULE | Freq: Every day | ORAL | Status: DC
  Administered 2023-01-11: 5 mg via ORAL
  Filled 2023-01-11: qty 1

## 2023-01-11 MED ORDER — LORAZEPAM 2 MG/ML IJ SOLN
0.5000 mg | Freq: Once | INTRAMUSCULAR | Status: AC
  Administered 2023-01-11: 0.5 mg via INTRAVENOUS
  Filled 2023-01-11: qty 1

## 2023-01-11 MED ORDER — KETOROLAC TROMETHAMINE 15 MG/ML IJ SOLN
15.0000 mg | Freq: Once | INTRAMUSCULAR | Status: AC
  Administered 2023-01-11: 15 mg via INTRAVENOUS
  Filled 2023-01-11: qty 1

## 2023-01-11 MED ORDER — ALBUMIN HUMAN 5 % IV SOLN
25.0000 g | Freq: Once | INTRAVENOUS | Status: AC
  Administered 2023-01-11: 25 g via INTRAVENOUS
  Filled 2023-01-11: qty 500

## 2023-01-11 MED ORDER — FAMOTIDINE IN NACL 20-0.9 MG/50ML-% IV SOLN
20.0000 mg | Freq: Two times a day (BID) | INTRAVENOUS | Status: DC
  Administered 2023-01-11 – 2023-01-12 (×3): 20 mg via INTRAVENOUS
  Filled 2023-01-11 (×5): qty 50

## 2023-01-11 MED ORDER — ORAL CARE MOUTH RINSE: 15.0000 mL | OROMUCOSAL | Status: DC | PRN

## 2023-01-11 MED ORDER — LACTATED RINGERS IV BOLUS
1000.0000 mL | Freq: Once | INTRAVENOUS | Status: AC
  Administered 2023-01-11: 1000 mL via INTRAVENOUS

## 2023-01-11 MED ORDER — ACETAMINOPHEN 325 MG PO TABS
650.0000 mg | ORAL_TABLET | Freq: Four times a day (QID) | ORAL | Status: DC | PRN
  Administered 2023-01-11 – 2023-01-12 (×3): 650 mg via ORAL
  Filled 2023-01-11 (×3): qty 2

## 2023-01-11 MED ORDER — ESCITALOPRAM OXALATE 10 MG PO TABS
10.0000 mg | ORAL_TABLET | Freq: Every day | ORAL | Status: DC
  Administered 2023-01-12 – 2023-01-14 (×3): 10 mg via ORAL
  Filled 2023-01-11 (×5): qty 1

## 2023-01-11 MED ORDER — NOREPINEPHRINE 4 MG/250ML-% IV SOLN
0.0000 ug/min | INTRAVENOUS | Status: DC
  Administered 2023-01-11: 2 ug/min via INTRAVENOUS
  Filled 2023-01-11 (×2): qty 250

## 2023-01-11 MED ORDER — PROMETHAZINE HCL 25 MG PO TABS
25.0000 mg | ORAL_TABLET | ORAL | Status: DC
  Administered 2023-01-11 – 2023-01-12 (×10): 25 mg via ORAL
  Filled 2023-01-11 (×12): qty 1

## 2023-01-11 MED ORDER — CLONAZEPAM 0.5 MG PO TABS
0.5000 mg | ORAL_TABLET | Freq: Every day | ORAL | Status: DC
  Administered 2023-01-11 – 2023-01-14 (×4): 0.5 mg via ORAL
  Filled 2023-01-11 (×6): qty 1

## 2023-01-11 MED ORDER — METRONIDAZOLE 500 MG/100ML IV SOLN
500.0000 mg | Freq: Two times a day (BID) | INTRAVENOUS | Status: DC
  Administered 2023-01-11 – 2023-01-14 (×9): 500 mg via INTRAVENOUS
  Filled 2023-01-11 (×9): qty 100

## 2023-01-11 MED ORDER — POTASSIUM CHLORIDE 10 MEQ/100ML IV SOLN
10.0000 meq | INTRAVENOUS | Status: AC
  Administered 2023-01-11 (×2): 10 meq via INTRAVENOUS
  Filled 2023-01-11 (×2): qty 100

## 2023-01-11 MED ORDER — KETOROLAC TROMETHAMINE 30 MG/ML IJ SOLN
30.0000 mg | Freq: Once | INTRAMUSCULAR | Status: AC
  Administered 2023-01-11: 30 mg via INTRAVENOUS
  Filled 2023-01-11: qty 1

## 2023-01-11 MED ORDER — MIRTAZAPINE 15 MG PO TABS
15.0000 mg | ORAL_TABLET | Freq: Every day | ORAL | Status: DC
  Administered 2023-01-11 – 2023-01-13 (×4): 15 mg via ORAL
  Filled 2023-01-11 (×6): qty 1

## 2023-01-11 MED ORDER — TRAZODONE HCL 50 MG PO TABS
50.0000 mg | ORAL_TABLET | Freq: Every evening | ORAL | Status: DC | PRN
  Administered 2023-01-11 – 2023-01-16 (×4): 50 mg via ORAL
  Filled 2023-01-11 (×4): qty 1

## 2023-01-11 MED ORDER — ENOXAPARIN SODIUM 40 MG/0.4ML IJ SOSY: 40.0000 mg | PREFILLED_SYRINGE | Freq: Every day | INTRAMUSCULAR | Status: DC

## 2023-01-11 MED ORDER — SODIUM CHLORIDE 0.9 % IV SOLN
12.5000 mg | Freq: Once | INTRAVENOUS | Status: AC
  Administered 2023-01-11: 12.5 mg via INTRAVENOUS
  Filled 2023-01-11: qty 12.5

## 2023-01-11 NOTE — Progress Notes (Signed)
  Echocardiogram 2D Echocardiogram has been performed.  Victoria Andrews 01/11/2023, 9:30 AM

## 2023-01-11 NOTE — Progress Notes (Addendum)
Pt seen for hypotension.  She reports feeling generally unwell but not worse than she has been.    BP trending back down with SBP now in 80s. HR has increased to low 100s. Patient is asymptomatic. Plan to give a bolus of LR, continue close monitoring.   ADDENDUM: BP remained low and another 1 liter LR was given and labs (CMP, CBC, lactate, cortisol) were sent.   ADDENDUM 2: Still hypotensive after >5 liters IVF though HR notably normal. Concerning change in labs noted this am. PCCM will see her, appreciate their help.

## 2023-01-11 NOTE — Assessment & Plan Note (Addendum)
Initially had hold and will resume once formulation verified.  See below.

## 2023-01-11 NOTE — ED Notes (Signed)
Spoke with Dr Blima Singer at this time concerning pt's continued low BP and pt receiving higher level of care. Dr Blima Singer to reach out to critical care.

## 2023-01-11 NOTE — Progress Notes (Signed)
PICC line removed per order.  No complications with removal.  Pressure held to obtain hemostasis.  Vaseline, gauze, tegaderm applied.  After care instructions reviewed with patient.

## 2023-01-11 NOTE — Assessment & Plan Note (Addendum)
-  presented with fever, tachycardia and SBP in the 80s.  -CXR, UA and CT A/P are negative. Most concerning for bacteremia since she has PICC line for TPN.  -Blood cultures pending -continue broad-spectrum antibiotics with IV vancomycin, cefepime and Flagyl -BP is fluid responsive.  Continue aggressive fluid hydration overnight.

## 2023-01-11 NOTE — Assessment & Plan Note (Signed)
-  potassium of 3.3. Administer IV potassium x 2.

## 2023-01-11 NOTE — Assessment & Plan Note (Addendum)
-  continue home scheduled Phenergan, and will add as needed Compazine. -Continue home Klonopin for anxiety and has also helped nausea  -continue home mirtazapine -continue Marinol -Follows outpatient with GI at [facility] In discussion with nutrition, patient following gastric sleeve surgery in 2016, had no follow-up and is not really been on any vitamins since.  Nutrition working to convince patient of the benefit of this.

## 2023-01-11 NOTE — Consult Note (Signed)
NAME:  Victoria Andrews, MRN:  [id number], DOB:  Feb 01, 1996, LOS: 0 ADMISSION DATE:  01/10/2023, CONSULTATION DATE:  1/13 REFERRING MD:  Opyd - TRH, CHIEF COMPLAINT:  n/dy heaves    History of Present Illness:  27 yo F PMH s/p gastric sleeve in 2016, s/p hiatal hernia repair x2 (most recently in late 2023) intractable n/v with question of cyclical vomiting vs cannabinoid hyperemesis, and severe malnutrition req TPN presented to [facility] 1/12 with n/v (dry heaves), rigors, chills and fevers.  Initially hypotensive, improved w IVF. Admitted to [facility] with concern for possible sepsis vs hypovolemia-- started on abx and cx data acquired.   Overnight pt decompensated developing shock refractory to IVF + new leukopenia thrombocytopenia anemia metabolic acidosis and lactic acidosis  PCCM is consulted in this setting   Pertinent  Medical History  S/p gastric sleeve S/p hiatal hernia repair x2 Bipolar 1 Cyclical vomiting vs cannabinoid hyperemesis Protein calorie malnutrition Intractable n/v   Significant Hospital Events: Including procedures, antibiotic start and stop dates in addition to other pertinent events   1/12 to ED w fever, n/v(dry heaving), rigors. Hypotension, improved w   Interim History / Subjective:  Overnight has developed shock refractory to numerous IVF boluses, new leukopenia, new lactic acidosis and new thrombocytopenia   Objective   Blood pressure 92/62, pulse 83, temperature 99.2 F (37.3 C), temperature source Oral, resp. rate (!) 23, height 5\' 5"  (1.651 m), weight 68 kg, last menstrual period 12/26/2022, SpO2 99 %.        Intake/Output Summary (Last 24 hours) at 01/11/2023 0816 Last data filed at 01/11/2023 0553 Gross per 24 hour  Intake 5597.5 ml  Output --  Net 5597.5 ml   Filed Weights   01/10/23 1821  Weight: 68 kg    Examination: General: wdwn ill appearing adult F NAD  HENT: NCAT pink mm Lungs: CTAb even unlabored on RA  Cardiovascular: rr s1s2 no rgm   Abdomen: soft ndnt  Extremities: no acute joint deformity no cyanosis clubbing or edema. RUE PICC  Neuro: AAOx4 following commands  GU: defer   Resolved Hospital Problem list     Assessment & Plan:   Shock - favor septic shock -sx/hx not c/f PE, MI ,tamponade. Is not hypovolemic.  - consider also relative adrenal insufficiency. AM cortisol slightly low at 5.6 @430  AM. Prior hospitalizations ranged 15-33 @ 8-9AM. Lytes not c/w addisions  -most concerning infectious source would be bacteremia r/t PICC + TPN. UA not c/w UTI and CXR without evidence of PNA. Lipase is elevated but CT a/p without findings c/w pancreatitis.  P -admit to ICU -start NE -- I have placed order for IV team to place 03/11/23 PIV. When this is placed, if NE req is <10, please change NE over to the PIV to help facilitate PICC removal. If NE is > 10, please let PCCM know so we can guide next steps.  -broad abx -follow cx data -send PCT  -STAT ECHO  -albumin  -defer adding stress dose steroids for now while we gather more data  Lactic acidosis NAGMA -in setting of underlying process. Significant rigors  P -will also send for APAP, salicylate, volatiles -add on CK   -trend   ?pancreatitis -lipase elevated to 201. No CT findings c/w pancreatitis. No abd pain.  P -supportive care -PRN lipase   Acute Thrombocytopenia Acute anemia Acute leukopenia  -in setting of above shock process +/- some component of dilution. Is not having and overt bleeding P -STAT  DIC panel   Intractable n/v Malnutrition r/t above requiring TPN Hx gastric sleeve (2016)  Hx hiatal hernia s/p repair x2 Hx Cyclical vomiting syndrome vs cannabinoid hyperemesis  Hx cholecystectomy  -Multiple hospital/ED presentations for this since Nov 2024  ([facility], [facility], [facility], here. This is third admission)  -prior weed use, stopped in Nov after concern was raised for cannabinoid hyperemesis syndrome -Underwent PICC placement at [facility] for TPN -a  couple days prior to this admission, was started on marinol by GI for appetite stim  -workup at OSH has included EGD and UGI barium study -- no gastroparesis P -phenergan -pepcid -hold TPN -- hoping to remove PICC with concern for PICC infection   Hx Bipolar 1 -supportive care    Best Practice (right click and "Reselect all SmartList Selections" daily)   Diet/type: NPO DVT prophylaxis: SCD GI prophylaxis: H2B Lines: Central line Foley:  N/A Code Status:  full code Last date of multidisciplinary goals of care discussion [1/13]  Labs   CBC: Recent Labs  Lab 01/10/23 1948 01/11/23 0422 01/11/23 0557  WBC 7.6 3.4* 4.0  NEUTROABS 6.5  --   --   HGB 14.5 8.3* 10.0*  HCT 42.2 23.0* 29.6*  MCV 92.7 91.3 92.2  PLT 181 78* 103*    Basic Metabolic Panel: Recent Labs  Lab 01/10/23 1948 01/11/23 0422  NA 134* 131*  K 3.3* 3.5  CL 99 102  CO2 25 18*  GLUCOSE 92 75  BUN 9 6  CREATININE 0.77 0.50  CALCIUM 9.0 7.1*   GFR: Estimated Creatinine Clearance: 95 mL/min (by C-G formula based on SCr of 0.5 mg/dL). Recent Labs  Lab 01/10/23 1948 01/10/23 2006 01/11/23 0422 01/11/23 0557  WBC 7.6  --  3.4* 4.0  LATICACIDVEN 1.2 0.8 5.6*  --     Liver Function Tests: Recent Labs  Lab 01/10/23 1948 01/11/23 0422  AST 23 21  ALT 25 19  ALKPHOS 62 31*  BILITOT 1.4* 0.9  PROT 6.9 3.2*  ALBUMIN 3.6 1.7*   Recent Labs  Lab 01/10/23 1948  LIPASE 201*   No results for input(s): "AMMONIA" in the last 168 hours.  ABG No results found for: "PHART", "PCO2ART", "PO2ART", "HCO3", "TCO2", "ACIDBASEDEF", "O2SAT"   Coagulation Profile: Recent Labs  Lab 01/10/23 1948  INR 1.3*    Cardiac Enzymes: No results for input(s): "CKTOTAL", "CKMB", "CKMBINDEX", "TROPONINI" in the last 168 hours.  HbA1C: No results found for: "HGBA1C"  CBG: No results for input(s): "GLUCAP" in the last 168 hours.  Review of Systems:   Review of Systems  Constitutional:  Positive for  chills, fever and malaise/fatigue.  HENT: Negative.    Eyes: Negative.   Respiratory: Negative.    Cardiovascular: Negative.   Gastrointestinal:  Positive for nausea and vomiting. Negative for abdominal pain, blood in stool, constipation, diarrhea, heartburn and melena.  Genitourinary: Negative.   Musculoskeletal:  Positive for myalgias.  Skin: Negative.   Neurological: Negative.   Endo/Heme/Allergies: Negative.   Psychiatric/Behavioral: Negative.      Past Medical History:  She,  has a past medical history of Hypotension, postural, Kidney stones, Pancreatitis, and Renal disorder.  Intractable n/v Cyclical vomiting THC use   Surgical History:   Past Surgical History:  Procedure Laterality Date   BREAST SURGERY     Breast lift   HERNIA REPAIR     hiatal hernia   LAPAROSCOPIC GASTRIC SLEEVE RESECTION     Tummy Tuck       Social History:  reports that she has never smoked. She has never used smokeless tobacco. She reports that she does not currently use alcohol. She reports that she does not use drugs.   Hx THC  Family History:  Her family history includes Atrial fibrillation in her mother; Cancer in her paternal grandmother; Congestive Heart Failure in her maternal grandfather; Diverticulosis in her paternal uncle; Muscular dystrophy in her maternal grandmother; Prostate cancer in her paternal grandfather.   Allergies Allergies  Allergen Reactions   Ciprofloxacin Nausea And Vomiting   Ondansetron Nausea And Vomiting   Other Hives    Pt allergic to dermabond     Home Medications  Prior to Admission medications   Medication Sig Start Date End Date Taking? Authorizing Provider  clonazePAM (KLONOPIN) 0.5 MG tablet Take by mouth. 01/02/23   [provider]  dronabinol (MARINOL) 5 MG capsule Take by mouth. 01/09/23   [provider]  escitalopram (LEXAPRO) 10 MG tablet Take by mouth.    [provider]  famotidine (PEPCID) 20 MG tablet Take by  mouth.    [provider]  loperamide (IMODIUM) 2 MG capsule Take by mouth.    [provider]  mirtazapine (REMERON) 15 MG tablet Take 15 mg by mouth daily. 11/25/22   [provider]  promethazine (PHENERGAN) 25 MG tablet Take by mouth. 01/06/23 02/08/23  [provider]  scopolamine (TRANSDERM-SCOP) 1 MG/3DAYS Place onto the skin. 11/28/22 01/03/24  [provider]  traZODone (DESYREL) 50 MG tablet Take 1 tablet by mouth at bedtime. 07/08/22 01/31/23  [provider]     Critical care time: 62      CRITICAL CARE Performed by: Cristal Generous   Total critical care time: 50 minutes  Critical care time was exclusive of separately billable procedures and treating other patients. Critical care was necessary to treat or prevent imminent or life-threatening deterioration.  Critical care was time spent personally by me on the following activities: development of treatment plan with patient and/or surrogate as well as nursing, discussions with consultants, evaluation of patient's response to treatment, examination of patient, obtaining history from patient or surrogate, ordering and performing treatments and interventions, ordering and review of laboratory studies, ordering and review of radiographic studies, pulse oximetry and re-evaluation of patient's condition.  Eliseo Gum MSN, AGACNP-BC [place] for pager  01/11/2023, 8:16 AM

## 2023-01-11 NOTE — ED Notes (Signed)
Admitting MD notified of pt's low trending BP. Fluid bolus orders placed.

## 2023-01-11 NOTE — H&P (Signed)
History and Physical    Patient: Victoria Andrews KYH:[id number] DOB: 04/29/1996 DOA: 01/10/2023 DOS: the patient was seen and examined on 01/11/2023 PCP: Eugenia Pancoast, FNP  Patient coming from: Home  Chief Complaint:  Chief Complaint  Patient presents with   Abdominal Pain   HPI: Victoria Andrews is a 27 y.o. 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   Pt had fever for the past several days but went as high as 103 F today with rigors and right sided abdominal pain, headache. Saw PCP today and was advised to present to ED. Pt has had intermittent nausea and vomiting since her sleeve gastrectomy in 2016.  However since 11/27/2022 she reports daily nausea vomiting and dry heaving.  Has been following with GI Dr. Hervey Ard at [facility].  Patient recently hospitalized at [facility] from 12/17/2022 to 01/01/2023 for dehydration, intractable nausea and vomiting, and protein calorie malnutrition. Psychiatry and GI were consulted. Underwent EGD without any significant findings. She had trials of Haldo, Reglan, phenergan, compazine, ativan, scopolamine with minimal improvement and had orthostatic hypotension. She had PICC line placed and started on TPN on 12/23/22.  Continues to have dry heaves and vomiting while on TPN.  Right now feels like taking scheduled Phenergan is the only thing that helps.   Also has been diagnosed with cyclic vomiting syndrome in the past and has since quit marijuana.  Her UDS was positive for THC today but reportedly was started on Marinol by GI about 2 days ago.  In the ED, she was febrile with temperature 100.4 F, heart rate of 140, initially hypotensive with BP of 80 over 60s with improvement following fluids.  No leukocytosis or anemia.  Lactate within normal limits.  Mild  hyponatremia of 134, K of 3.3, creatinine of 0.77.  Lipase was elevated at 201, total bilirubin 1.4.  LFTs within normal limits.  CT abdomen pelvis was obtained which was negative for pancreatitis or any acute findings.  UA negative. UDS +THC  She was started empirically on IV vancomycin, cefepime and Flagyl and hospitalist was consulted for admission. Review of Systems: As mentioned in the history of present illness. All other systems reviewed and are negative. Past Medical History:  Diagnosis Date   Hypotension, postural    Kidney stones    Pancreatitis    Renal disorder    acute kidney failure   Past Surgical History:  Procedure Laterality Date   BREAST SURGERY     Breast lift   HERNIA REPAIR     hiatal hernia   LAPAROSCOPIC GASTRIC SLEEVE RESECTION     Tummy Tuck     Social History:  reports that she has never smoked. She has never used smokeless tobacco. She reports that she does not currently use alcohol. She reports that she does not use drugs.  Allergies  Allergen Reactions   Ciprofloxacin Nausea And Vomiting   Ondansetron Nausea And Vomiting   Other Hives    Pt allergic to dermabond    Family History  Problem Relation Age of Onset   Atrial fibrillation Mother    Muscular dystrophy Maternal Grandmother    Congestive Heart Failure Maternal Grandfather    Cancer Paternal Grandmother        unknown   Prostate cancer Paternal Grandfather    Diverticulosis Paternal Uncle     Prior to Admission medications  Medication Sig Start Date End Date Taking? Authorizing Provider  clonazePAM (KLONOPIN) 0.5 MG tablet Take by mouth. 01/02/23   [provider]  dronabinol (MARINOL) 5 MG capsule Take by mouth. 01/09/23   [provider]  escitalopram (LEXAPRO) 10 MG tablet Take by mouth.    [provider]  famotidine (PEPCID) 20 MG tablet Take by mouth.    [provider]  loperamide (IMODIUM) 2 MG capsule Take by mouth.    [provider]  mirtazapine (REMERON) 15 MG tablet Take 15 mg by mouth daily. 11/25/22   [provider]  promethazine (PHENERGAN) 25 MG tablet Take by mouth. 01/06/23 02/08/23  [provider]  scopolamine (TRANSDERM-SCOP) 1 MG/3DAYS Place onto the skin. 11/28/22 01/03/24  [provider]  traZODone (DESYREL) 50 MG tablet Take 1 tablet by mouth at bedtime. 07/08/22 01/31/23  [provider]    Physical Exam: Vitals:   01/10/23 2345 01/11/23 0009 01/11/23 0015 01/11/23 0115  BP: (!) 98/57  99/61 (!) 97/55  Pulse: (!) 111  (!) 106 (!) 105  Resp: (!) 25  (!) 22 (!) 23  Temp:  (!) 101.9 F (38.8 C)    TempSrc:  Oral    SpO2: 96%  99% 97%  Weight:      Height:       Constitutional: NAD, calm, comfortable, diaphoretic young female laying flat in bed Eyes: lids and conjunctivae normal ENMT: Mucous membranes are moist.  Neck: normal, supple Respiratory: clear to auscultation bilaterally, no wheezing, no crackles. Normal respiratory effort. No accessory muscle use.  Cardiovascular: Regular rate and rhythm, no murmurs / rubs / gallops. No extremity edema.  Abdomen: Soft, nontender no tenderness.no rebound tenderness, guarding or rigidity.  Bowel sounds positive.  Musculoskeletal: no clubbing / cyanosis. No joint deformity upper and lower extremities. Good ROM, no contractures. Normal muscle tone.  Skin: no rashes, lesions, ulcers. No induration Neurologic: CN 2-12 grossly intact.  Psychiatric: Normal judgment and insight. Alert and oriented x 3. Normal mood. Data Reviewed:  See HPI   Assessment and Plan: * Sepsis with hypotension ([place]) -presented with fever, tachycardia and SBP in the 80s.  -CXR, UA and CT A/P are negative. Most concerning for bacteremia since she has PICC line for TPN.  -Blood cultures pending -continue broad-spectrum antibiotics with IV vancomycin, cefepime and Flagyl -BP is fluid responsive.  Continue aggressive fluid hydration  overnight.  Hypokalemia -potassium of 3.3. Administer IV potassium x 2.   Intractable nausea and vomiting -continue home scheduled Phenergan -Continue home Klonopin for anxiety and has also helped nausea  -continue home mirtazapine -continue Marinol -Follows outpatient with GI at [facility]  On total parenteral nutrition (TPN) -hold overnight. Has been receiving TPH overnight. Family to verify formulation she has been receiving at home tomorrow.       Advance Care Planning: Full  Consults: none  Family Communication: mother and father at bedside  Severity of Illness: The appropriate patient status for this patient is OBSERVATION. Observation status is judged to be reasonable and necessary in order to provide the required intensity of service to ensure the patient's safety. The patient's presenting symptoms, physical exam findings, and initial radiographic and laboratory data in the context of their medical condition is felt to place them at decreased risk for further clinical deterioration. Furthermore, it is anticipated that the patient will be medically stable for discharge from the hospital within 2 midnights of admission.   Author: Orene Desanctis, DO 01/11/2023 1:27 AM  For on call review [email or web address].

## 2023-01-11 NOTE — Progress Notes (Signed)
An USGPIV (ultrasound guided PIV) has been placed for short-term vasopressor infusion. A correctly placed ivWatch must be used when administering Vasopressors. Should this treatment be needed beyond 72 hours, central line access should be obtained.  It will be the responsibility of the bedside nurse to follow best practice to prevent extravasations.

## 2023-01-11 NOTE — ED Notes (Signed)
Continuing low BP communicated to Opid MD at this time. Fluid bolus ordered.

## 2023-01-12 DIAGNOSIS — A419 Sepsis, unspecified organism: Secondary | ICD-10-CM | POA: Diagnosis not present

## 2023-01-12 DIAGNOSIS — R112 Nausea with vomiting, unspecified: Secondary | ICD-10-CM | POA: Diagnosis not present

## 2023-01-12 DIAGNOSIS — E46 Unspecified protein-calorie malnutrition: Secondary | ICD-10-CM | POA: Diagnosis not present

## 2023-01-12 DIAGNOSIS — I959 Hypotension, unspecified: Secondary | ICD-10-CM | POA: Diagnosis not present

## 2023-01-12 LAB — CBC
HCT: 32.8 % — ABNORMAL LOW (ref 36.0–46.0)
Hemoglobin: 11.5 g/dL — ABNORMAL LOW (ref 12.0–15.0)
MCH: 32.1 pg (ref 26.0–34.0)
MCHC: 35.1 g/dL (ref 30.0–36.0)
MCV: 91.6 fL (ref 80.0–100.0)
Platelets: 99 10*3/uL — ABNORMAL LOW (ref 150–400)
RBC: 3.58 MIL/uL — ABNORMAL LOW (ref 3.87–5.11)
RDW: 14.1 % (ref 11.5–15.5)
WBC: 3.9 10*3/uL — ABNORMAL LOW (ref 4.0–10.5)
nRBC: 0 % (ref 0.0–0.2)

## 2023-01-12 LAB — PROTIME-INR
INR: 1.3 — ABNORMAL HIGH (ref 0.8–1.2)
Prothrombin Time: 16.4 seconds — ABNORMAL HIGH (ref 11.4–15.2)

## 2023-01-12 LAB — COMPREHENSIVE METABOLIC PANEL
ALT: 24 U/L (ref 0–44)
AST: 33 U/L (ref 15–41)
Albumin: 2.7 g/dL — ABNORMAL LOW (ref 3.5–5.0)
Alkaline Phosphatase: 41 U/L (ref 38–126)
Anion gap: 10 (ref 5–15)
BUN: 6 mg/dL (ref 6–20)
CO2: 19 mmol/L — ABNORMAL LOW (ref 22–32)
Calcium: 7.8 mg/dL — ABNORMAL LOW (ref 8.9–10.3)
Chloride: 108 mmol/L (ref 98–111)
Creatinine, Ser: 0.61 mg/dL (ref 0.44–1.00)
GFR, Estimated: >60 mL/min (ref >60)
Glucose, Bld: 114 mg/dL — ABNORMAL HIGH (ref 70–99)
Potassium: 2.9 mmol/L — ABNORMAL LOW (ref 3.5–5.1)
Sodium: 137 mmol/L (ref 135–145)
Total Bilirubin: 1.3 mg/dL — ABNORMAL HIGH (ref 0.3–1.2)
Total Protein: 4.9 g/dL — ABNORMAL LOW (ref 6.5–8.1)

## 2023-01-12 LAB — PROCALCITONIN: Procalcitonin: 2.89 ng/mL

## 2023-01-12 LAB — GLUCOSE, CAPILLARY: Glucose-Capillary: 91 mg/dL (ref 70–99)

## 2023-01-12 LAB — LIPASE, BLOOD: Lipase: 118 U/L — ABNORMAL HIGH (ref 11–51)

## 2023-01-12 MED ORDER — LACTATED RINGERS IV BOLUS
1000.0000 mL | Freq: Once | INTRAVENOUS | Status: AC
  Administered 2023-01-12: 1000 mL via INTRAVENOUS

## 2023-01-12 MED ORDER — POTASSIUM CHLORIDE 20 MEQ PO PACK
40.0000 meq | PACK | ORAL | Status: AC
  Administered 2023-01-12 (×2): 40 meq via ORAL
  Filled 2023-01-12 (×2): qty 2

## 2023-01-12 MED ORDER — PROMETHAZINE HCL 25 MG PO TABS
25.0000 mg | ORAL_TABLET | ORAL | Status: DC | PRN
  Administered 2023-01-12 – 2023-01-13 (×4): 25 mg via ORAL
  Filled 2023-01-12 (×6): qty 1

## 2023-01-12 MED ORDER — OXYCODONE HCL 5 MG PO TABS
5.0000 mg | ORAL_TABLET | Freq: Four times a day (QID) | ORAL | Status: DC | PRN
  Administered 2023-01-12 – 2023-01-17 (×6): 5 mg via ORAL
  Filled 2023-01-12 (×6): qty 1

## 2023-01-12 MED ORDER — ATROPINE SULFATE 1 MG/10ML IJ SOSY
PREFILLED_SYRINGE | INTRAMUSCULAR | Status: AC
  Filled 2023-01-12: qty 10

## 2023-01-12 MED ORDER — CHLORHEXIDINE GLUCONATE CLOTH 2 % EX PADS
6.0000 | MEDICATED_PAD | CUTANEOUS | Status: DC
  Administered 2023-01-12 – 2023-01-14 (×2): 6 via TOPICAL

## 2023-01-12 MED ORDER — HYDROCORTISONE SOD SUC (PF) 100 MG IJ SOLR
100.0000 mg | Freq: Three times a day (TID) | INTRAMUSCULAR | Status: DC
  Administered 2023-01-12 – 2023-01-14 (×7): 100 mg via INTRAVENOUS
  Filled 2023-01-12 (×8): qty 2

## 2023-01-12 MED ORDER — FAMOTIDINE 20 MG PO TABS
20.0000 mg | ORAL_TABLET | Freq: Two times a day (BID) | ORAL | Status: DC
  Administered 2023-01-12 – 2023-01-14 (×4): 20 mg via ORAL
  Filled 2023-01-12 (×5): qty 1

## 2023-01-12 MED ORDER — DEXTROSE IN LACTATED RINGERS 5 % IV SOLN
INTRAVENOUS | Status: DC
  Administered 2023-01-14: 1000 mL via INTRAVENOUS

## 2023-01-12 MED ORDER — MORPHINE SULFATE (PF) 2 MG/ML IV SOLN
1.0000 mg | Freq: Once | INTRAVENOUS | Status: AC | PRN
  Administered 2023-01-12: 1 mg via INTRAVENOUS
  Filled 2023-01-12: qty 1

## 2023-01-12 NOTE — Progress Notes (Signed)
eLink Physician-Brief Progress Note Patient Name: Victoria Andrews DOB: Aug 15, 1996 MRN: [id number]   Date of Service  01/12/2023  HPI/Events of Note  Patient c/o 8/10 pain given Oxycodone  5 mg and ineffective.  Not a camera capable room. Patient reportedly generalized  eICU Interventions  Spoke with bedside RN who went to ask patient what she usually takes. She states morphine given prior worked. She states she is unable to take ibuprofen as she is a bariatric patient.  Ordered a one time dose of morphine 1 mg IV. Oxycodone to be given as scheduled     Intervention Category Intermediate Interventions: Pain - evaluation and management  Judd Lien 01/12/2023, 8:51 PM

## 2023-01-12 NOTE — Progress Notes (Signed)
NAME:  Victoria Andrews, MRN:  [id number], DOB:  04-Dec-1996, LOS: 1 ADMISSION DATE:  01/10/2023, CONSULTATION DATE:  1/13 REFERRING MD:  Opyd - TR[facility], CHIEF COMPLAINT:  n/dy heaves    History of Present Illness:  27 yo F PMH s/p gastric sleeve in 2016, s/p hiatal hernia repair x2 (most recently in late 2023) intractable n/v with question of cyclical vomiting vs cannabinoid hyperemesis, and severe malnutrition req TPN presented to [facility] 1/12 with n/v (dry heaves), rigors, chills and fevers.  Initially hypotensive, improved w IVF. Admitted to [facility] with concern for possible sepsis vs hypovolemia-- started on abx and cx data acquired.   Overnight pt decompensated developing shock refractory to IVF + new leukopenia thrombocytopenia anemia metabolic acidosis and lactic acidosis  PCCM is consulted in this setting   Pertinent  Medical History  S/p gastric sleeve S/p hiatal hernia repair x2 Bipolar 1 Cyclical vomiting vs cannabinoid hyperemesis Protein calorie malnutrition Intractable n/v   Significant Hospital Events: Including procedures, antibiotic start and stop dates in addition to other pertinent events   1/12 to ED w fever, n/v(dry heaving), rigors. Hypotension PICC removed 1/13 Blood culture 1/12 >  Urine culture 1/12 > Echocardiogram 1/13 > abnormal septal motion, LVEF 67-89%, normal diastolic parameters, normal RV size and function, no valvular abnormalities  Interim History / Subjective:  PICC removed Patient reports tremors are a bit better.  She is tolerating some clears without abdominal pain or nausea Norepinephrine 4 I/O+ 7.2 L total A.m. cortisol low on 1/13 Lactic acid cleared, WBC 4.0, platelets 120 (both improved) Acetaminophen and salicylates both negative, volatile's pending DIC panel > elevated INR, no schistocytes seen  Objective   Blood pressure 108/74, pulse (!) 55, temperature 97.7 F (36.5 C), temperature source Oral, resp. rate 17, height 5\' 5"  (1.651 m),  weight 68 kg, last menstrual period 12/26/2022, SpO2 99 %.        Intake/Output Summary (Last 24 hours) at 01/12/2023 0739 Last data filed at 01/12/2023 0700 Gross per 24 hour  Intake 1616.44 ml  Output --  Net 1616.44 ml    Filed Weights   01/10/23 1821  Weight: 68 kg    Examination: General: Ill-appearing young woman laying in bed in no distress HENT: Oropharynx is clear, pupils equal, no stridor Lungs: Clear bilaterally Cardiovascular: Distant, regular, no murmur Abdomen: Nondistended, positive bowel sounds Extremities: Right upper extremity PICC is out Neuro: Awake, alert, interacting appropriately, follows commands, good strength GU: Deferred  Resolved Hospital Problem list     Assessment & Plan:   Shock - favor septic shock -sx/hx not c/f PE, MI ,tamponade. Is not hypovolemic.  - consider also relative adrenal insufficiency. AM cortisol slightly low at 5.6 @430  AM. Prior hospitalizations ranged 15-33 @ 8-9AM. Lytes not c/w addisions  -most concerning infectious source would be bacteremia r/t PICC + TPN. UA not c/w UTI and CXR without evidence of PNA. Lipase is elevated but CT a/p without findings c/w pancreatitis.  P -Wean norepinephrine as able -Continue volume resuscitation -Low normal LV function on echocardiogram, suspect due to sepsis -Continue broad-spectrum antibiotics and follow culture data, negative to date -Follow procalcitonin, lactic acid has cleared -Add stress dose steroids on 1/14 given low a.m. cortisol on 1/13 and continued pressor need  Lactic acidosis, cleared NAGMA, improving -in setting of underlying process and GI losses. Significant rigors, CK normal P -Salicylates negative, volatile's pending -Follow BMP for improving acidosis  ?pancreatitis -lipase elevated to 201. No CT findings c/w pancreatitis. No abd  pain.  P -supportive care -Repeat lipase 1/15  Acute Thrombocytopenia, improving Acute anemia Acute leukopenia, improving -in  setting of above shock process +/- some component of dilution. Is not having and overt bleeding.  No schistocytes on DIC panel P -Continue to follow CBC  Intractable n/v Malnutrition r/t above requiring TPN Hx gastric sleeve (2016)  Hx hiatal hernia s/p repair x2 Hx Cyclical vomiting syndrome vs cannabinoid hyperemesis  Hx cholecystectomy  -Multiple hospital/ED presentations for this since Nov 2024  ([facility], [facility], [facility], here. This is third admission)  -prior weed use, stopped in Nov after concern was raised for cannabinoid hyperemesis syndrome -Underwent PICC placement at [place] for TPN -a couple days prior to this admission, was started on marinol by GI for appetite stim  -workup at OSH has included EGD and UGI barium study -- no gastroparesis P -Marinol held -Phenergan as needed -Pepcid as ordered -TPN on hold -Tolerating clear diet at this time  Hx Bipolar 1 -Will confirm her home regimen and restart mood stabilizers now that she is tolerating p.o.   Best Practice (right click and "Reselect all SmartList Selections" daily)   Diet/type: clear liquids DVT prophylaxis: SCD GI prophylaxis: H2B Lines: N/A Foley:  N/A Code Status:  full code Last date of multidisciplinary goals of care discussion [1/13]  Labs   CBC: Recent Labs  Lab 01/10/23 1948 01/11/23 0422 01/11/23 0557 01/11/23 0836  WBC 7.6 3.4* 4.0  --   NEUTROABS 6.5  --   --   --   HGB 14.5 8.3* 10.0*  --   HCT 42.2 23.0* 29.6*  --   MCV 92.7 91.3 92.2  --   PLT 181 78* 103* 120*     Basic Metabolic Panel: Recent Labs  Lab 01/10/23 1948 01/11/23 0422  NA 134* 131*  K 3.3* 3.5  CL 99 102  CO2 25 18*  GLUCOSE 92 75  BUN 9 6  CREATININE 0.77 0.50  CALCIUM 9.0 7.1*    GFR: Estimated Creatinine Clearance: 95 mL/min (by C-G formula based on SCr of 0.5 mg/dL). Recent Labs  Lab 01/10/23 1948 01/10/23 2006 01/11/23 0422 01/11/23 0557 01/11/23 0836 01/11/23 1851  PROCALCITON  --   --   --    --  3.05  --   WBC 7.6  --  3.4* 4.0  --   --   LATICACIDVEN 1.2 0.8 5.6*  --   --  1.2     Liver Function Tests: Recent Labs  Lab 01/10/23 1948 01/11/23 0422  AST 23 21  ALT 25 19  ALKPHOS 62 31*  BILITOT 1.4* 0.9  PROT 6.9 3.2*  ALBUMIN 3.6 1.7*    Recent Labs  Lab 01/10/23 1948  LIPASE 201*    No results for input(s): "AMMONIA" in the last 168 hours.  ABG No results found for: "PHART", "PCO2ART", "PO2ART", "HCO3", "TCO2", "ACIDBASEDEF", "O2SAT"   Coagulation Profile: Recent Labs  Lab 01/10/23 1948 01/11/23 0836  INR 1.3* 1.6*     Cardiac Enzymes: Recent Labs  Lab 01/11/23 0836  CKTOTAL 224    HbA1C: No results found for: "HGBA1C"  CBG: Recent Labs  Lab 01/11/23 1752 01/12/23 0313  GLUCAP 84 91    Critical care time: 32 minutes     Baltazar Apo, MD, PhD 01/12/2023, 7:40 AM [facility] [phone] or if no answer before 7:00PM call [phone] For any issues after 7:00PM please call eLink [phone]

## 2023-01-13 ENCOUNTER — Telehealth: Payer: Self-pay | Admitting: Family

## 2023-01-13 DIAGNOSIS — R112 Nausea with vomiting, unspecified: Secondary | ICD-10-CM | POA: Diagnosis not present

## 2023-01-13 DIAGNOSIS — F3160 Bipolar disorder, current episode mixed, unspecified: Secondary | ICD-10-CM | POA: Diagnosis not present

## 2023-01-13 DIAGNOSIS — R1115 Cyclical vomiting syndrome unrelated to migraine: Secondary | ICD-10-CM | POA: Diagnosis not present

## 2023-01-13 DIAGNOSIS — T80219A Unspecified infection due to central venous catheter, initial encounter: Secondary | ICD-10-CM

## 2023-01-13 DIAGNOSIS — R6521 Severe sepsis with septic shock: Secondary | ICD-10-CM

## 2023-01-13 DIAGNOSIS — A419 Sepsis, unspecified organism: Secondary | ICD-10-CM | POA: Diagnosis not present

## 2023-01-13 LAB — URINE CULTURE

## 2023-01-13 LAB — COMPREHENSIVE METABOLIC PANEL
ALT: 21 U/L (ref 0–44)
AST: 24 U/L (ref 15–41)
Albumin: 2.7 g/dL — ABNORMAL LOW (ref 3.5–5.0)
Alkaline Phosphatase: 41 U/L (ref 38–126)
Anion gap: 9 (ref 5–15)
BUN: <5 mg/dL — ABNORMAL LOW (ref 6–20)
CO2: 20 mmol/L — ABNORMAL LOW (ref 22–32)
Calcium: 8.2 mg/dL — ABNORMAL LOW (ref 8.9–10.3)
Chloride: 110 mmol/L (ref 98–111)
Creatinine, Ser: 0.52 mg/dL (ref 0.44–1.00)
GFR, Estimated: >60 mL/min (ref >60)
Glucose, Bld: 138 mg/dL — ABNORMAL HIGH (ref 70–99)
Potassium: 3.6 mmol/L (ref 3.5–5.1)
Sodium: 139 mmol/L (ref 135–145)
Total Bilirubin: 0.8 mg/dL (ref 0.3–1.2)
Total Protein: 4.9 g/dL — ABNORMAL LOW (ref 6.5–8.1)

## 2023-01-13 LAB — CBC
HCT: 30.6 % — ABNORMAL LOW (ref 36.0–46.0)
Hemoglobin: 10.8 g/dL — ABNORMAL LOW (ref 12.0–15.0)
MCH: 32.4 pg (ref 26.0–34.0)
MCHC: 35.3 g/dL (ref 30.0–36.0)
MCV: 91.9 fL (ref 80.0–100.0)
Platelets: 104 10*3/uL — ABNORMAL LOW (ref 150–400)
RBC: 3.33 MIL/uL — ABNORMAL LOW (ref 3.87–5.11)
RDW: 14.3 % (ref 11.5–15.5)
WBC: 5.4 10*3/uL (ref 4.0–10.5)
nRBC: 0 % (ref 0.0–0.2)

## 2023-01-13 LAB — MAGNESIUM: Magnesium: 1.7 mg/dL (ref 1.7–2.4)

## 2023-01-13 LAB — VITAMIN D 25 HYDROXY (VIT D DEFICIENCY, FRACTURES): Vit D, 25-Hydroxy: 13.07 ng/mL — ABNORMAL LOW (ref 30–100)

## 2023-01-13 LAB — FOLATE: Folate: 12.4 ng/mL (ref >5.9)

## 2023-01-13 LAB — PROCALCITONIN: Procalcitonin: 1.57 ng/mL

## 2023-01-13 LAB — LIPASE, BLOOD: Lipase: 80 U/L — ABNORMAL HIGH (ref 11–51)

## 2023-01-13 LAB — RETICULOCYTES
Immature Retic Fract: 9.3 % (ref 2.3–15.9)
RBC.: 3.34 MIL/uL — ABNORMAL LOW (ref 3.87–5.11)
Retic Count, Absolute: 50.8 10*3/uL (ref 19.0–186.0)
Retic Ct Pct: 1.5 % (ref 0.4–3.1)

## 2023-01-13 LAB — IRON AND TIBC
Iron: 50 ug/dL (ref 28–170)
Saturation Ratios: 28 % (ref 10.4–31.8)
TIBC: 182 ug/dL — ABNORMAL LOW (ref 250–450)
UIBC: 132 ug/dL

## 2023-01-13 LAB — FERRITIN: Ferritin: 126 ng/mL (ref 11–307)

## 2023-01-13 LAB — VITAMIN B12: Vitamin B-12: 356 pg/mL (ref 180–914)

## 2023-01-13 LAB — PHOSPHORUS: Phosphorus: 2.5 mg/dL (ref 2.5–4.6)

## 2023-01-13 LAB — CK: Total CK: 71 U/L (ref 38–234)

## 2023-01-13 MED ORDER — BOOST / RESOURCE BREEZE PO LIQD CUSTOM: 1.0000 | Freq: Three times a day (TID) | ORAL | Status: DC

## 2023-01-13 MED ORDER — PROCHLORPERAZINE MALEATE 5 MG PO TABS: 5.0000 mg | ORAL_TABLET | Freq: Four times a day (QID) | ORAL | Status: DC | PRN

## 2023-01-13 MED ORDER — CLONAZEPAM 0.125 MG PO TBDP
0.2500 mg | ORAL_TABLET | Freq: Once | ORAL | Status: AC | PRN
  Administered 2023-01-13: 0.25 mg via ORAL
  Filled 2023-01-13: qty 2

## 2023-01-13 MED ORDER — MORPHINE SULFATE (PF) 2 MG/ML IV SOLN
1.0000 mg | Freq: Once | INTRAVENOUS | Status: AC | PRN
  Administered 2023-01-13: 1 mg via INTRAVENOUS
  Filled 2023-01-13: qty 1

## 2023-01-13 MED ORDER — ADULT MULTIVITAMIN W/MINERALS CH
1.0000 | ORAL_TABLET | Freq: Two times a day (BID) | ORAL | Status: DC
  Administered 2023-01-13 – 2023-01-14 (×3): 1 via ORAL
  Filled 2023-01-13 (×6): qty 1

## 2023-01-13 MED ORDER — PROMETHAZINE HCL 25 MG PO TABS
25.0000 mg | ORAL_TABLET | ORAL | Status: DC
  Administered 2023-01-13 – 2023-01-14 (×4): 25 mg via ORAL
  Filled 2023-01-13 (×11): qty 1

## 2023-01-13 MED ORDER — THIAMINE HCL 100 MG/ML IJ SOLN
100.0000 mg | Freq: Every day | INTRAMUSCULAR | Status: AC
  Administered 2023-01-13 – 2023-01-17 (×5): 100 mg via INTRAVENOUS
  Filled 2023-01-13 (×5): qty 2

## 2023-01-13 MED ORDER — CALCIUM CARBONATE ANTACID 500 MG PO CHEW
200.0000 mg | CHEWABLE_TABLET | Freq: Three times a day (TID) | ORAL | Status: DC
  Administered 2023-01-13 – 2023-01-16 (×5): 200 mg via ORAL
  Filled 2023-01-13 (×9): qty 1

## 2023-01-13 NOTE — Telephone Encounter (Signed)
FYI- appt has been canceled.

## 2023-01-13 NOTE — Progress Notes (Signed)
Initial Nutrition Assessment  DOCUMENTATION CODES:   Not applicable  INTERVENTION:  Encourage adequate PO intake as tolerated Snacks TID (jello and juice, blended strawberry yogurt if available) Boost Breeze po TID, each supplement provides 250 kcal and 9 grams of protein MVI with minerals BID 500mg  TUMS TID Check micronutrient labs: Vitamin A, Vitamin C, Vitamin D, B12, B6, anemia panel, zinc, copper Recommend thiamine supplementation given high refeeding risk; discussed with MD who agrees  NUTRITION DIAGNOSIS:   Inadequate oral intake related to nausea, vomiting as evidenced by per patient/family report  GOAL:   Patient will meet greater than or equal to 90% of their needs  MONITOR:   PO intake, Supplement acceptance, Diet advancement, Labs, Weight trends  REASON FOR ASSESSMENT:   Consult Assessment of nutrition requirement/status  ASSESSMENT:   Pt presented with abdominal pain, admitted d/t sepsis with hypotension. PMH significant for chronic nausea/vomiting, s/p sleeve gastrectomy 2016, hiatal hernia repair with redo (11/11/2022), cholecystectomy, cyclic vomiting syndrome, bipolar 1 disorder.   Patient sitting up in bed with mom present at bedside. They were both very tearful during visit d/t her ongoing health concerns. She reports that after her sleeve gastrectomy in 2016, she had ongoing complications including kidney stones and gallstones eventually leading to a cholecystectomy. Around that time she was placed on nutrition supplements but feels as though she has "PTSD" as she was unable to tolerate these supplements without emesis or diarrhea. She denies having seen a Dietitian following her bariatric surgery. Has not been taking multivitamins either.   Pt endorses intermittent n/v/d since her sleeve gastrectomy but more significantly since her recent hernia repair in November. Since that time she has had significant n/v and has been unable to tolerate any PO. She had a  PICC line placed and was started on TPN, however she states that she did not tolerate TPN well therefore not allowing it to run at full rate as prescribed.   Pt states that she has had ongoing tremors since November but has noticed improvements during admission.   She states that she has the most relief from nausea with phenergan scheduled q4h. She feels anxious when she feels as though her nausea is coming back and wants to get ahead of it.   Observed 2 uneaten meal trays in pt's room. We discussed "non-milk type" nutrition supplements. She is agreeable to try sips of Boost Breeze as well as strawberry yogurt. Will slowly begin to reintroduce additional foods as diet is able to be advanced. Pt is also agreeable to vitamin regimen however states that she does not do well with lots of medications. Discussed ability to crush them and eat with applesauce or yogurt if needed. Recommend ongoing outpatient follow up with a Dietitian to help improve her long term nutritional status.   Pt reports that her weight in November was about 178 lbs and endorses weight loss since that time. Reviewed weight history. On 10/15/22 her weight during an outpatient appt was noted to be 80.4 kg. Current weight is documented to be 68.9 kg. This is a weight loss of 14.3% which is clinically significant for time frame.   Medications: pepcid, solu-cortef, remeron IV drips: abx, D5 in LR @75ml /hr  Labs: BUN <5, lipase 80  NUTRITION - FOCUSED PHYSICAL EXAM:  Flowsheet Row Most Recent Value  Orbital Region No depletion  Upper Arm Region No depletion  Thoracic and Lumbar Region No depletion  Buccal Region No depletion  Temple Region No depletion  Clavicle Bone Region No  depletion  Clavicle and Acromion Bone Region No depletion  Scapular Bone Region No depletion  Dorsal Hand No depletion  Patellar Region No depletion  Anterior Thigh Region No depletion  Posterior Calf Region No depletion  Edema (RD Assessment) None   Hair Other (Comment)  [pt reports hair loss]  Eyes Reviewed  Mouth Reviewed  Skin Reviewed  Nails Reviewed       Diet Order:   Diet Order             Diet full liquid Room service appropriate? Yes; Fluid consistency: Thin  Diet effective now                   EDUCATION NEEDS:   Education needs have been addressed  Skin:  Skin Assessment: Reviewed RN Assessment  Last BM:  unknown  Height:   Ht Readings from Last 1 Encounters:  01/10/23 5\' 5"  (1.651 m)    Weight:   Wt Readings from Last 1 Encounters:  01/10/23 68 kg   BMI:  Body mass index is 24.96 kg/m.  Estimated Nutritional Needs:   Kcal:  1900-2100  Protein:  95-110g  Fluid:  >/=1.9L  Clayborne Dana, RDN, LDN Clinical Nutrition

## 2023-01-13 NOTE — Assessment & Plan Note (Signed)
Met criteria for septic shock on admission given tachycardia, tachypnea, persistent hypotension despite fluid resuscitation requiring pressor support I & D infected PICC line source.  Noted elevated procalcitonin.  Initially placed on broad-spectrum antibiotics and procalcitonin continues to trend downward.  Given negative MRSA, will discontinue vancomycin.  Continue cefepime and Flagyl.  With negative initial blood cultures, will replace PICC line on 1/15.

## 2023-01-13 NOTE — Plan of Care (Signed)
  Problem: Education: Goal: Knowledge of General Education information will improve Description: Including pain rating scale, medication(s)/side effects and non-pharmacologic comfort measures Outcome: Progressing   Problem: Clinical Measurements: Goal: Will remain free from infection Outcome: Progressing   Problem: Activity: Goal: Risk for activity intolerance will decrease Outcome: Progressing   Problem: Nutrition: Goal: Adequate nutrition will be maintained Outcome: Not Progressing

## 2023-01-13 NOTE — Hospital Course (Addendum)
Ms. Wettstein is a 27 yo female w/ BPD, gastric sleeve surgery done in 2016 with no postop follow-up who has had multiple hospitalizations and admissions for severe malnutrition secondary to cyclical vomiting and nausea. Recently hospitalized at [facility] from 12/19 - 1/3 for similar findings, had extensive workup including evaluations by gastroenterology with EGD which was unremarkable as well as psychiatry and trials of Haldol, Reglan, Phenergan, Compazine, Ativan and scopolamine with mild improvement.   Patient had a PICC line placed and started on TPN starting 12/25 ongoing.  She continued to have dry heaving and vomiting.  She presented to ED 1/12 with abdominal pain, rigors and fever and was admitted for septic shock. Workup unrevealing for source but felt to be secondary to PICC line. PICC line discontinued and patient placed on antibiotics plus pressor support and weaned off of pressors.  She completed 7-day course of cefepime during hospitalization.

## 2023-01-13 NOTE — Telephone Encounter (Signed)
Pt's mom, Lattie Haw, called to cancel pt's ov with [facility] on 01/15/23 due to being hospitalized. Lattie Haw stated the pt was in the ICU until yesterday & stated the pt has been diagnosed with "sepsis". Lattie Haw stated they haven't received any recent lab results back yet but will keep [facility] updated. Call back # [phone]

## 2023-01-13 NOTE — Telephone Encounter (Signed)
Noted. Labs have not been completed yet.

## 2023-01-13 NOTE — Assessment & Plan Note (Signed)
- 

## 2023-01-13 NOTE — Progress Notes (Addendum)
[facility] Progress Note  Patient: Victoria Andrews    FAO:[id number]  DOA: 01/10/2023    Date of Service: the patient was seen and examined on 01/13/2023  Brief hospital course: Patient is a 27 year old female with past medical history of bipolar disorder, gastric sleeve surgery done in 2016 with no postop follow-up who has had multiple hospitalizations and admissions for severe malnutrition secondary to cyclical vomiting and nausea.  Patient most recently hospitalized at [facility] from 12/19 - 1/3 for similar findings.  Patient extensive workup including evaluations by gastroenterology with EGD which was unremarkable as well as psychiatry and trials of Haldol, Reglan, Phenergan, Compazine, Ativan and scopolamine with minimal improvement.  Patient had a PICC line placed and started on TPN starting 12/25 ongoing.  She continued to have dry heaving and vomiting.  She currently reports some improvement with scheduled Phenergan every 4 hours.  She presented to the emergency room on 1/12 with abdominal pain, rigors and fever and was admitted for septic shock.  Workup unrevealing for source which was then felt to be secondary to PICC line.  PICC line discontinued and patient placed on antibiotics plus pressor support.  Able to weaned off of pressors and transferred back to hospitalist service starting 1/15.   Assessment and Plan: Septic shock (HCC)-resolved as of 01/13/2023 Met criteria for septic shock on admission given tachycardia, tachypnea, persistent hypotension despite fluid resuscitation requiring pressor support I & D infected PICC line source.  Noted elevated procalcitonin.  Initially placed on broad-spectrum antibiotics and procalcitonin continues to trend downward.  Given negative MRSA, will discontinue vancomycin.  Continue cefepime and Flagyl.  With negative initial blood cultures, will replace PICC line on 1/15.  On total parenteral nutrition (TPN) Initially had hold and will resume once  formulation verified.  See below.  Intractable nausea and vomiting -continue home scheduled Phenergan, and will add as needed Compazine. -Continue home Klonopin for anxiety and has also helped nausea  -continue home mirtazapine -continue Marinol -Follows outpatient with GI at [facility] In discussion with nutrition, patient following gastric sleeve surgery in 2016, had no follow-up and is not really been on any vitamins since.  Nutrition working to convince patient of the benefit of this.  Hypokalemia -potassium of 3.3. Administer IV potassium x 2.   Bipolar 1 disorder, mixed ([place]) Continue home medications.       Body mass index is 24.96 kg/m.  Nutrition Problem: Inadequate oral intake Etiology: nausea, vomiting     Consultants: Critical care  Procedures: PICC line removal  Antimicrobials: IV cefepime 1/12-present IV vancomycin 1/12 - 1/15 IV Flagyl 1/12-present  Code Status: Full code   Subjective: Complains of some generalized pain from previous rigors  Objective: Vital signs were reviewed and unremarkable. Vitals:   01/13/23 0527 01/13/23 0748  BP: 106/64   Pulse: 82   Resp: 19   Temp: 98.7 F (37.1 C) 98.4 F (36.9 C)  SpO2: 100%     Intake/Output Summary (Last 24 hours) at 01/13/2023 1717 Last data filed at 01/13/2023 0800 Gross per 24 hour  Intake 750.09 ml  Output --  Net 750.09 ml   Filed Weights   01/10/23 1821  Weight: 68 kg   Body mass index is 24.96 kg/m.  Exam:  General: Alert and oriented x 3, no acute distress HEENT: Normocephalic, atraumatic, mucous membranes are moist Cardiovascular: Regular rate and rhythm, S1-S2 Respiratory: Clear to auscultation bilaterally Abdomen: Soft, nontender, nondistended, normal active bowel sounds Musculoskeletal: No clubbing or cyanosis or edema  Skin: No skin breaks, tears or lesions Psychiatry: Currently appropriate, no evidence of acute psychoses Neurology: No focal deficits  Data Reviewed: CK  level of 71.  Procalcitonin down to 1.57.  Stable white blood cell count.  Hemoglobin of 10.8.  Disposition:  Status is: Inpatient Remains inpatient appropriate because:  -Continued improvement in infection -Replacement of PICC line    Anticipated discharge date: 1/17  Family Communication: Mother at the bedside DVT Prophylaxis: SCDs Start: 01/11/23 1093    Author: Annita Brod ,MD 01/13/2023 5:17 PM  To reach On-call, see care teams to locate the attending and reach out via [email or web address]. Between 7PM-7AM, please contact night-coverage If you still have difficulty reaching the attending provider, please page the [facility] (Director on Call) for [facility] on amion for assistance.

## 2023-01-14 DIAGNOSIS — T80219A Unspecified infection due to central venous catheter, initial encounter: Secondary | ICD-10-CM | POA: Diagnosis not present

## 2023-01-14 DIAGNOSIS — R112 Nausea with vomiting, unspecified: Secondary | ICD-10-CM | POA: Diagnosis not present

## 2023-01-14 LAB — PROCALCITONIN: Procalcitonin: 0.46 ng/mL

## 2023-01-14 MED ORDER — SACCHAROMYCES BOULARDII 250 MG PO CAPS
250.0000 mg | ORAL_CAPSULE | Freq: Two times a day (BID) | ORAL | Status: DC
  Administered 2023-01-14: 250 mg via ORAL
  Filled 2023-01-14 (×4): qty 1

## 2023-01-14 MED ORDER — VITAMIN D (ERGOCALCIFEROL) 1.25 MG (50000 UNIT) PO CAPS
50000.0000 [IU] | ORAL_CAPSULE | ORAL | Status: DC
  Administered 2023-01-14: 50000 [IU] via ORAL
  Filled 2023-01-14: qty 1

## 2023-01-14 MED ORDER — DRONABINOL 2.5 MG PO CAPS
5.0000 mg | ORAL_CAPSULE | Freq: Two times a day (BID) | ORAL | Status: DC
  Administered 2023-01-16 – 2023-01-17 (×2): 5 mg via ORAL
  Filled 2023-01-14 (×2): qty 2

## 2023-01-14 MED ORDER — METHOCARBAMOL 500 MG PO TABS
500.0000 mg | ORAL_TABLET | Freq: Four times a day (QID) | ORAL | Status: DC | PRN
  Administered 2023-01-14: 500 mg via ORAL
  Filled 2023-01-14: qty 1

## 2023-01-14 MED ORDER — PROMETHAZINE HCL 12.5 MG RE SUPP: 12.5000 mg | Freq: Four times a day (QID) | RECTAL | Status: DC | PRN

## 2023-01-14 MED ORDER — HYDROCORTISONE SOD SUC (PF) 100 MG IJ SOLR
100.0000 mg | Freq: Two times a day (BID) | INTRAMUSCULAR | Status: DC
  Administered 2023-01-14: 100 mg via INTRAVENOUS
  Filled 2023-01-14 (×2): qty 2

## 2023-01-14 MED ORDER — PROMETHAZINE HCL 12.5 MG PO TABS: 12.5000 mg | ORAL_TABLET | Freq: Four times a day (QID) | ORAL | Status: DC | PRN

## 2023-01-14 MED ORDER — SODIUM CHLORIDE 0.9 % IV SOLN
6.2500 mg | Freq: Four times a day (QID) | INTRAVENOUS | Status: DC | PRN
  Administered 2023-01-14 – 2023-01-17 (×4): 6.25 mg via INTRAVENOUS
  Filled 2023-01-14 (×4): qty 0.25

## 2023-01-14 MED ORDER — SODIUM CHLORIDE 0.9 % IV SOLN
6.2500 mg | Freq: Once | INTRAVENOUS | Status: AC
  Administered 2023-01-14: 6.25 mg via INTRAVENOUS
  Filled 2023-01-14: qty 0.25

## 2023-01-14 MED ORDER — VITAMIN B-12 1000 MCG PO TABS
500.0000 ug | ORAL_TABLET | Freq: Every day | ORAL | Status: DC
  Administered 2023-01-14: 500 ug via ORAL
  Filled 2023-01-14 (×2): qty 1

## 2023-01-14 NOTE — Progress Notes (Signed)
Patient has c/o nausea vomiting and dry heaves all day off and on. Been working with DR with medications to help with this. Dr making changes to treat see MAR    Bryanah Sidell, Tivis Ringer, RN

## 2023-01-14 NOTE — Plan of Care (Signed)
  Problem: Clinical Measurements: Goal: Will remain free from infection Outcome: Progressing   Problem: Activity: Goal: Risk for activity intolerance will decrease Outcome: Progressing   Problem: Nutrition: Goal: Adequate nutrition will be maintained Outcome: Progressing

## 2023-01-14 NOTE — Telephone Encounter (Signed)
Noted  

## 2023-01-14 NOTE — Progress Notes (Signed)
PROGRESS NOTE Victoria Andrews  QQI:[id number] DOB: [birth year] DOA: 01/10/2023 PCP: Mort Sawyers, FNP   Brief Narrative/Hospital Course: 26yom w/ BPD, gastric sleeve surgery done in 2016 with no postop follow-up who has had multiple hospitalizations and admissions for severe malnutrition secondary to cyclical vomiting and nausea. Recently hospitalized at [facility] from 12/19 - 1/3 for similar findings, had extensive workup including evaluations by gastroenterology with EGD which was unremarkable as well as psychiatry and trials of Haldol, Reglan, Phenergan, Compazine, Ativan and scopolamine with minimal improvement.  Patient had a PICC line placed and started on TPN starting 12/25 ongoing.  She continued to have dry heaving and vomiting. She currently reports some improvement with scheduled Phenergan every 4 hours. She presented to ED 1/12 with abdominal pain, rigors and fever and was admitted for septic shock.Workup unrevealing for source which was then felt to be secondary to PICC line.PICC line discontinued and patient placed on antibiotics plus pressor support and weaned off of pressors and transferred back to hospitalist 1/15.   Subjective: Patient seen and examined this morning mother at the bedside Mother reports she was having some chills overnight but no documented fever Vitals has been stable   Assessment and Plan: Active Problems:   Infection of peripherally inserted central venous catheter (PICC)   On total parenteral nutrition (TPN)   Intractable nausea and vomiting   Hypokalemia   Bipolar 1 disorder, mixed (HCC)   Protein-calorie malnutrition (HCC)   Septic shock POA-currently resolved Suspected to have PICC line infection: Presented tachycardia tachypnea persistent hypotension despite IV fluids needing pressor support in ICU.  Source felt to be PICC line blood blood cultures negative.  PICC line removed.  Blood culture from admission negative.  MRSA PCR screen negative on  cefepime/Flagyl.  Vancomycin discontinued.  On hydrocortisone 100 mg every 8 hours since admission> start to taper off.  Intractable nausea vomiting On TPN for inadequate oral intake: Has had extensive workup elsewhere had EGD seen by psychiatry with trials of Haldol Reglan Phenergan Compazine Ativan and scopolamine patch.  Continue her current regimen with Klonopin, Remeron, Phenergan every 4 hours multivitamin thiamine, Marinol follow-up with outpatient GI at [facility].  In discussing nutrition patient following gastric sleeve surgery in 2016 and had no follow-up and has not really been on any vitamins since. Needs PICC for TPN prior to dc. Nutrition Status: Nutrition Problem: Inadequate oral intake Etiology: nausea, vomiting Signs/Symptoms: per patient/family report Interventions: Refer to RD note for recommendations    Post gastric sleeve surgery Vitamin D deficiency: At 13.0 start replacement.  Folate 12.4, ferritin normal, B12 normal but on the lower side 356- add b12 supplement.  Strongly encouraged to follow-up for post surgery issues and needs regular dietitian follow-up  Mild transaminitis/elevated levels in the setting of nausea and vomiting.  CT abdomen pelvis on 1/2 with negative finding.  Chest x-ray unremarkable.  Hypokalemia resolved Bipolar 1 disorder continue home meds.  DVT prophylaxis: SCDs Start: 01/11/23 0646 Code Status:   Code Status: Full Code Family Communication: plan of care discussed with patient and her mother and brother at bedside. Patient status is: inpatient because of ongoing IV antibiotics Level of care: Med-Surg   Dispo: The patient is from: home            Anticipated disposition: Home  Objective: Vitals last 24 hrs: Vitals:   01/13/23 0748 01/13/23 2050 01/14/23 0419 01/14/23 0714  BP:  106/68 112/74 (!) 95/56  Pulse:  75 80 63  Resp:  20 20  17  Temp: 98.4 F (36.9 C) 98 F (36.7 C) 97.7 F (36.5 C) 97.7 F (36.5 C)  TempSrc: Oral Oral Oral  Oral  SpO2:  98% 100% 98%  Weight:      Height:       Weight change:   Physical Examination: General exam: alert awake, older than stated age HEENT:Oral mucosa moist, Ear/Nose WNL grossly Respiratory system: bilaterally clear BS, no use of accessory muscle Cardiovascular system: S1 & S2 +, No JVD. Gastrointestinal system: Abdomen soft,NT,ND, BS+ Nervous System:Alert, awake, moving extremities. Extremities: LE edema neg,distal peripheral pulses palpable.  Skin: No rashes,no icterus. MSK: Normal muscle bulk,tone, power  Medications reviewed:  Scheduled Meds:  calcium carbonate  200 mg of elemental calcium Oral TID   Chlorhexidine Gluconate Cloth  6 each Topical Daily   clonazePAM  0.5 mg Oral Daily   vitamin B-12  500 mcg Oral Daily   escitalopram  10 mg Oral Daily   famotidine  20 mg Oral BID   feeding supplement  1 Container Oral TID BM   hydrocortisone sod succinate (SOLU-CORTEF) inj  100 mg Intravenous Q12H   mirtazapine  15 mg Oral QHS   multivitamin with minerals  1 tablet Oral BID   promethazine  25 mg Oral Q4H   saccharomyces boulardii  250 mg Oral BID   thiamine (VITAMIN B1) injection  100 mg Intravenous Daily   Vitamin D (Ergocalciferol)  50,000 Units Oral Q7 days  Continuous Infusions:  ceFEPime (MAXIPIME) IV 2 g (01/14/23 0945)   dextrose 5% lactated ringers 1,000 mL (01/14/23 0800)   metronidazole 500 mg (01/14/23 0935)    Diet Order             Diet regular Room service appropriate? Yes; Fluid consistency: Thin  Diet effective now                 Nutrition Problem: Inadequate oral intake Etiology: nausea, vomiting Signs/Symptoms: per patient/family report Interventions: Refer to RD note for recommendations   Intake/Output Summary (Last 24 hours) at 01/14/2023 1119 Last data filed at 01/14/2023 0900 Gross per 24 hour  Intake 120 ml  Output --  Net 120 ml   Net IO Since Admission: 10,114.12 mL [01/14/23 1119]  Wt Readings from Last 3 Encounters:   01/10/23 68 kg  01/10/23 68.9 kg  12/03/22 65.8 kg     Unresulted Labs (From admission, onward)     Start     Ordered   01/13/23 1440  Copper, serum  Once,   R       Question:  Specimen collection method  Answer:  Lab=Lab collect   01/13/23 1440   01/13/23 1440  Vitamin A  Once,   R       Question:  Specimen collection method  Answer:  Lab=Lab collect   01/13/23 1440   01/13/23 1421  Vitamin B6  Once,   R       Question:  Specimen collection method  Answer:  Lab=Lab collect   01/13/23 1440   01/13/23 1420  Vitamin C  Once,   R       Question:  Specimen collection method  Answer:  Lab=Lab collect   01/13/23 1440   01/13/23 1420  Zinc  Once,   R       Question:  Specimen collection method  Answer:  Lab=Lab collect   01/13/23 1440          Data Reviewed: I have personally reviewed following labs and  imaging studies CBC: Recent Labs  Lab 01/10/23 1948 01/11/23 0422 01/11/23 0557 01/11/23 0836 01/12/23 0813 01/13/23 0013  WBC 7.6 3.4* 4.0  --  3.9* 5.4  NEUTROABS 6.5  --   --   --   --   --   HGB 14.5 8.3* 10.0*  --  11.5* 10.8*  HCT 42.2 23.0* 29.6*  --  32.8* 30.6*  MCV 92.7 91.3 92.2  --  91.6 91.9  PLT 181 78* 103* 120* 99* 123456*   Basic Metabolic Panel: Recent Labs  Lab 01/10/23 1948 01/11/23 0422 01/12/23 0813 01/13/23 0013  NA 134* 131* 137 139  K 3.3* 3.5 2.9* 3.6  CL 99 102 108 110  CO2 25 18* 19* 20*  GLUCOSE 92 75 114* 138*  BUN 9 6 6  <5*  CREATININE 0.77 0.50 0.61 0.52  CALCIUM 9.0 7.1* 7.8* 8.2*  MG  --   --   --  1.7  PHOS  --   --   --  2.5   GFR: Estimated Creatinine Clearance: 95 mL/min (by C-G formula based on SCr of 0.52 mg/dL). Liver Function Tests: Recent Labs  Lab 01/10/23 1948 01/11/23 0422 01/12/23 0813 01/13/23 0013  AST 23 21 33 24  ALT 25 19 24 21   ALKPHOS 62 31* 41 41  BILITOT 1.4* 0.9 1.3* 0.8  PROT 6.9 3.2* 4.9* 4.9*  ALBUMIN 3.6 1.7* 2.7* 2.7*   Recent Labs  Lab 01/10/23 1948 01/12/23 0813 01/13/23 0013   LIPASE 201* 118* 80*   Recent Labs  Lab 01/10/23 1948 01/11/23 0836 01/12/23 0813  INR 1.3* 1.6* 1.3*   Recent Results (from the past 240 hour(s))  Resp panel by RT-PCR (RSV, Flu A&B, Covid) Urine, Clean Catch     Status: None   Collection Time: 01/10/23  6:28 PM   Specimen: Urine, Clean Catch; Nasal Swab  Result Value Ref Range Status   SARS Coronavirus 2 by RT PCR NEGATIVE NEGATIVE Final    Comment: (NOTE) SARS-CoV-2 target nucleic acids are NOT DETECTED.  The SARS-CoV-2 RNA is generally detectable in upper respiratory specimens during the acute phase of infection. The lowest concentration of SARS-CoV-2 viral copies this assay can detect is 138 copies/mL. A negative result does not preclude SARS-Cov-2 infection and should not be used as the sole basis for treatment or other patient management decisions. A negative result may occur with  improper specimen collection/handling, submission of specimen other than nasopharyngeal swab, presence of viral mutation(s) within the areas targeted by this assay, and inadequate number of viral copies(<138 copies/mL). A negative result must be combined with clinical observations, patient history, and epidemiological information. The expected result is Negative.  Fact Sheet for Patients:  EntrepreneurPulse.com.au  Fact Sheet for Healthcare Providers:  IncredibleEmployment.be  This test is no t yet approved or cleared by the [place] FDA and  has been authorized for detection and/or diagnosis of SARS-CoV-2 by FDA under an Emergency Use Authorization (EUA). This EUA will remain  in effect (meaning this test can be used) for the duration of the COVID-19 declaration under Section 564(b)(1) of the Act, 21 U.S.C.section 360bbb-3(b)(1), unless the authorization is terminated  or revoked sooner.       Influenza A by PCR NEGATIVE NEGATIVE Final   Influenza B by PCR NEGATIVE NEGATIVE Final    Comment:  (NOTE) The Xpert Xpress SARS-CoV-2/FLU/RSV plus assay is intended as an aid in the diagnosis of influenza from Nasopharyngeal swab specimens and should not be used as a  sole basis for treatment. Nasal washings and aspirates are unacceptable for Xpert Xpress SARS-CoV-2/FLU/RSV testing.  Fact Sheet for Patients: EntrepreneurPulse.com.au  Fact Sheet for Healthcare Providers: IncredibleEmployment.be  This test is not yet approved or cleared by the [place] FDA and has been authorized for detection and/or diagnosis of SARS-CoV-2 by FDA under an Emergency Use Authorization (EUA). This EUA will remain in effect (meaning this test can be used) for the duration of the COVID-19 declaration under Section 564(b)(1) of the Act, 21 U.S.C. section 360bbb-3(b)(1), unless the authorization is terminated or revoked.     Resp Syncytial Virus by PCR NEGATIVE NEGATIVE Final    Comment: (NOTE) Fact Sheet for Patients: EntrepreneurPulse.com.au  Fact Sheet for Healthcare Providers: IncredibleEmployment.be  This test is not yet approved or cleared by the [place] FDA and has been authorized for detection and/or diagnosis of SARS-CoV-2 by FDA under an Emergency Use Authorization (EUA). This EUA will remain in effect (meaning this test can be used) for the duration of the COVID-19 declaration under Section 564(b)(1) of the Act, 21 U.S.C. section 360bbb-3(b)(1), unless the authorization is terminated or revoked.  Performed at [facility], [place] [address], [place], [place] [address]   Urine Culture     Status: Abnormal   Collection Time: 01/10/23  6:28 PM   Specimen: In/Out Cath Urine  Result Value Ref Range Status   Specimen Description IN/OUT CATH URINE  Final   Special Requests   Final    NONE Performed at [facility], [place] [address], [place], [place] [address]    Culture MULTIPLE SPECIES PRESENT,  SUGGEST RECOLLECTION (A)  Final   Report Status 01/13/2023 FINAL  Final  Blood Culture (routine x 2)     Status: None (Preliminary result)   Collection Time: 01/10/23  7:33 PM   Specimen: BLOOD RIGHT ARM  Result Value Ref Range Status   Specimen Description BLOOD RIGHT ARM  Final   Special Requests   Final    BOTTLES DRAWN AEROBIC AND ANAEROBIC Blood Culture adequate volume   Culture   Final    NO GROWTH 3 DAYS Performed at [facility], [place] [address] [place], [place] [address]    Report Status PENDING  Incomplete  Blood Culture (routine x 2)     Status: None (Preliminary result)   Collection Time: 01/10/23  7:48 PM   Specimen: BLOOD RIGHT HAND  Result Value Ref Range Status   Specimen Description BLOOD RIGHT HAND  Final   Special Requests   Final    BOTTLES DRAWN AEROBIC ONLY Blood Culture adequate volume   Culture   Final    NO GROWTH 3 DAYS Performed at [place] [facility], [place] [address] [place], [place] [address]    Report Status PENDING  Incomplete  MRSA Next Gen by PCR, Nasal     Status: None   Collection Time: 01/11/23  9:51 PM   Specimen: Nasal Mucosa; Nasal Swab  Result Value Ref Range Status   MRSA by PCR Next Gen NOT DETECTED NOT DETECTED Final    Comment: (NOTE) The GeneXpert MRSA Assay (FDA approved for NASAL specimens only), is one component of a comprehensive MRSA colonization surveillance program. It is not intended to diagnose MRSA infection nor to guide or monitor treatment for MRSA infections. Test performance is not FDA approved in patients less than 70 years old. Performed at [facility], [place] [address] [place], [place] [address]  Antimicrobials: Anti-infectives (From admission, onward)    Start     Dose/Rate Route Frequency Ordered Stop   01/11/23 0800  vancomycin (VANCOCIN) IVPB 1000 mg/200 mL premix  Status:  Discontinued        1,000 mg 200 mL/hr over 60 Minutes Intravenous Every 12 hours 01/10/23 2006 01/13/23 1613    01/11/23 0230  ceFEPIme (MAXIPIME) 2 g in sodium chloride 0.9 % 100 mL IVPB        2 g 200 mL/hr over 30 Minutes Intravenous Every 8 hours 01/10/23 1838     01/11/23 0115  metroNIDAZOLE (FLAGYL) IVPB 500 mg        500 mg 100 mL/hr over 60 Minutes Intravenous 2 times daily 01/11/23 0104     01/10/23 1945  vancomycin (VANCOREADY) IVPB 500 mg/100 mL  Status:  Discontinued        500 mg 100 mL/hr over 60 Minutes Intravenous  Once 01/10/23 1835 01/10/23 1919   01/10/23 1930  vancomycin (VANCOREADY) IVPB 1500 mg/300 mL        1,500 mg 150 mL/hr over 120 Minutes Intravenous  Once 01/10/23 1919 01/11/23 0211   01/10/23 1830  ceFEPIme (MAXIPIME) 2 g in sodium chloride 0.9 % 100 mL IVPB        2 g 200 mL/hr over 30 Minutes Intravenous  Once 01/10/23 1828 01/10/23 2310   01/10/23 1830  metroNIDAZOLE (FLAGYL) IVPB 500 mg        500 mg 100 mL/hr over 60 Minutes Intravenous  Once 01/10/23 1828 01/10/23 2153   01/10/23 1830  vancomycin (VANCOCIN) IVPB 1000 mg/200 mL premix  Status:  Discontinued        1,000 mg 200 mL/hr over 60 Minutes Intravenous  Once 01/10/23 1828 01/10/23 1919      Culture/Microbiology    Component Value Date/Time   SDES BLOOD RIGHT HAND 01/10/2023 1948   SPECREQUEST  01/10/2023 1948    BOTTLES DRAWN AEROBIC ONLY Blood Culture adequate volume   CULT  01/10/2023 1948    NO GROWTH 3 DAYS Performed at [facility], [place] [address] [place], [place] [address]    REPTSTATUS PENDING 01/10/2023 1948  Radiology Studies: No results found.   LOS: 3 days   Antonieta Pert, MD [facility]  01/14/2023, 11:19 AM

## 2023-01-14 NOTE — Plan of Care (Signed)
  Problem: Education: Goal: Knowledge of General Education information will improve Description: Including pain rating scale, medication(s)/side effects and non-pharmacologic comfort measures Outcome: Progressing

## 2023-01-14 NOTE — Progress Notes (Signed)
Patient is not due for IV phenergan until 2128, refusing all of her oral medicines and care until she gets that.

## 2023-01-15 ENCOUNTER — Ambulatory Visit: Payer: BC Managed Care – PPO | Admitting: Family

## 2023-01-15 DIAGNOSIS — R197 Diarrhea, unspecified: Secondary | ICD-10-CM | POA: Diagnosis not present

## 2023-01-15 DIAGNOSIS — A419 Sepsis, unspecified organism: Secondary | ICD-10-CM | POA: Diagnosis not present

## 2023-01-15 DIAGNOSIS — R112 Nausea with vomiting, unspecified: Secondary | ICD-10-CM | POA: Diagnosis not present

## 2023-01-15 DIAGNOSIS — T80219A Unspecified infection due to central venous catheter, initial encounter: Secondary | ICD-10-CM | POA: Diagnosis not present

## 2023-01-15 LAB — COMPREHENSIVE METABOLIC PANEL
ALT: 19 U/L (ref 0–44)
AST: 21 U/L (ref 15–41)
Albumin: 2.5 g/dL — ABNORMAL LOW (ref 3.5–5.0)
Alkaline Phosphatase: 37 U/L — ABNORMAL LOW (ref 38–126)
Anion gap: 8 (ref 5–15)
BUN: 6 mg/dL (ref 6–20)
CO2: 26 mmol/L (ref 22–32)
Calcium: 8 mg/dL — ABNORMAL LOW (ref 8.9–10.3)
Chloride: 107 mmol/L (ref 98–111)
Creatinine, Ser: 0.59 mg/dL (ref 0.44–1.00)
GFR, Estimated: >60 mL/min (ref >60)
Glucose, Bld: 119 mg/dL — ABNORMAL HIGH (ref 70–99)
Potassium: 2.6 mmol/L — CL (ref 3.5–5.1)
Sodium: 141 mmol/L (ref 135–145)
Total Bilirubin: 0.6 mg/dL (ref 0.3–1.2)
Total Protein: 4.6 g/dL — ABNORMAL LOW (ref 6.5–8.1)

## 2023-01-15 LAB — CBC
HCT: 28.2 % — ABNORMAL LOW (ref 36.0–46.0)
Hemoglobin: 10 g/dL — ABNORMAL LOW (ref 12.0–15.0)
MCH: 31.7 pg (ref 26.0–34.0)
MCHC: 35.5 g/dL (ref 30.0–36.0)
MCV: 89.5 fL (ref 80.0–100.0)
Platelets: 133 10*3/uL — ABNORMAL LOW (ref 150–400)
RBC: 3.15 MIL/uL — ABNORMAL LOW (ref 3.87–5.11)
RDW: 14.3 % (ref 11.5–15.5)
WBC: 5.7 10*3/uL (ref 4.0–10.5)
nRBC: 0 % (ref 0.0–0.2)

## 2023-01-15 LAB — CULTURE, BLOOD (ROUTINE X 2)
Culture: NO GROWTH
Culture: NO GROWTH
Special Requests: ADEQUATE
Special Requests: ADEQUATE

## 2023-01-15 LAB — MAGNESIUM: Magnesium: 1.8 mg/dL (ref 1.7–2.4)

## 2023-01-15 LAB — VITAMIN A: Vitamin A (Retinoic Acid): 17.6 ug/dL — ABNORMAL LOW (ref 18.9–57.3)

## 2023-01-15 MED ORDER — POTASSIUM CHLORIDE CRYS ER 20 MEQ PO TBCR
40.0000 meq | EXTENDED_RELEASE_TABLET | Freq: Once | ORAL | Status: DC
  Filled 2023-01-15: qty 2

## 2023-01-15 MED ORDER — FAMOTIDINE IN NACL 20-0.9 MG/50ML-% IV SOLN
20.0000 mg | Freq: Once | INTRAVENOUS | Status: AC
  Administered 2023-01-15: 20 mg via INTRAVENOUS
  Filled 2023-01-15 (×3): qty 50

## 2023-01-15 MED ORDER — POTASSIUM CHLORIDE 10 MEQ/100ML IV SOLN
10.0000 meq | INTRAVENOUS | Status: AC
  Administered 2023-01-15: 10 meq via INTRAVENOUS
  Filled 2023-01-15 (×2): qty 100

## 2023-01-15 MED ORDER — SODIUM CHLORIDE 0.9 % IV SOLN
6.2500 mg | Freq: Once | INTRAVENOUS | Status: AC
  Administered 2023-01-15: 6.25 mg via INTRAVENOUS
  Filled 2023-01-15: qty 0.25

## 2023-01-15 MED ORDER — FAMOTIDINE 20 MG PO TABS
20.0000 mg | ORAL_TABLET | Freq: Two times a day (BID) | ORAL | Status: DC
  Filled 2023-01-15 (×2): qty 1

## 2023-01-15 MED ORDER — PROMETHAZINE HCL 25 MG PO TABS
25.0000 mg | ORAL_TABLET | ORAL | Status: DC
  Filled 2023-01-15 (×13): qty 1

## 2023-01-15 MED ORDER — POTASSIUM CHLORIDE 10 MEQ/100ML IV SOLN
10.0000 meq | INTRAVENOUS | Status: AC
  Administered 2023-01-15 (×3): 10 meq via INTRAVENOUS
  Filled 2023-01-15 (×2): qty 100

## 2023-01-15 MED ORDER — HYDROCORTISONE SOD SUC (PF) 100 MG IJ SOLR
100.0000 mg | Freq: Every day | INTRAMUSCULAR | Status: AC
  Administered 2023-01-15 – 2023-01-17 (×3): 100 mg via INTRAVENOUS
  Filled 2023-01-15 (×3): qty 2

## 2023-01-15 MED ORDER — LORAZEPAM 2 MG/ML IJ SOLN
1.0000 mg | Freq: Four times a day (QID) | INTRAMUSCULAR | Status: DC
  Administered 2023-01-15 – 2023-01-17 (×8): 1 mg via INTRAVENOUS
  Filled 2023-01-15 (×8): qty 1

## 2023-01-15 MED ORDER — SODIUM CHLORIDE 0.9 % IV SOLN
12.5000 mg | INTRAVENOUS | Status: DC
  Administered 2023-01-15 – 2023-01-17 (×6): 12.5 mg via INTRAVENOUS
  Filled 2023-01-15: qty 12.5
  Filled 2023-01-15: qty 0.5
  Filled 2023-01-15: qty 12.5
  Filled 2023-01-15 (×8): qty 0.5
  Filled 2023-01-15: qty 12.5
  Filled 2023-01-15: qty 0.5
  Filled 2023-01-15: qty 12.5
  Filled 2023-01-15: qty 0.5

## 2023-01-15 NOTE — Assessment & Plan Note (Signed)
  Ordering stool culture workup

## 2023-01-15 NOTE — Assessment & Plan Note (Signed)
Discussed with patient that she will need a psychiatrist if not already seeing one to manage bipolar with medications Patient verbalized acknowledgment

## 2023-01-15 NOTE — Assessment & Plan Note (Signed)
Ordering H. pylori to rule this out

## 2023-01-15 NOTE — Plan of Care (Signed)
  Problem: Clinical Measurements: Goal: Will remain free from infection Outcome: Progressing   Problem: Activity: Goal: Risk for activity intolerance will decrease Outcome: Progressing   Problem: Nutrition: Goal: Adequate nutrition will be maintained Outcome: Progressing

## 2023-01-15 NOTE — Assessment & Plan Note (Signed)
Various notes reviewed in epic Patient continuing ongoing follow-up with surgeon We will evaluate need for referral to infectious disease and/or GI

## 2023-01-15 NOTE — Progress Notes (Signed)
Progress Note    Victoria Andrews   CBU:[id number]  DOB: 14-May-1996  DOA: 01/10/2023     4 PCP: Eugenia Pancoast, FNP  Initial CC: Nausea/vomiting  Hospital Course: Victoria Andrews is a 27 yo female w/ BPD, gastric sleeve surgery done in 2016 with no postop follow-up who has had multiple hospitalizations and admissions for severe malnutrition secondary to cyclical vomiting and nausea. Recently hospitalized at [facility] from 12/19 - 1/3 for similar findings, had extensive workup including evaluations by gastroenterology with EGD which was unremarkable as well as psychiatry and trials of Haldol, Reglan, Phenergan, Compazine, Ativan and scopolamine with mild improvement.   Patient had a PICC line placed and started on TPN starting 12/25 ongoing.  She continued to have dry heaving and vomiting.  She presented to ED 1/12 with abdominal pain, rigors and fever and was admitted for septic shock. Workup unrevealing for source but felt to be secondary to PICC line. PICC line discontinued and patient placed on antibiotics plus pressor support and weaned off of pressors.   Interval History:  Still very nauseous this morning states Phenergan helps but Ativan also has helped and she is not able to tolerate the oral Klonopin. Intermittently tearful and anxious.  Mother present bedside.  They wish to continue current treatment in hopes that she improves further. Currently they are declining replacement of her PICC line at this time.  Assessment and Plan:  Septic shock POA-resolved Suspected to have PICC line infection: Presented tachycardia tachypnea persistent hypotension despite IV fluids needing pressor support in ICU.  Source felt to be PICC line; blood cultures negative.  PICC line removed.   - Blood culture from admission negative.  MRSA PCR screen negative  - on cefepime/Flagyl.  Vancomycin discontinued.  -Hydrocortisone being tapered off -De-escalate down to monotherapy cefepime for now.  Options  would be to complete 7 days of cefepime versus finish course with oral Levaquin  Diarrhea - patient complaining of watery green stool starting 1/16 - does have prior hx Cdiff - given clinical scenario, check Cdiff and GI panel   Intractable nausea vomiting On TPN for inadequate oral intake: Has had extensive workup elsewhere; EGD and seen by psychiatry with trials of Haldol Reglan Phenergan Compazine Ativan and scopolamine patch.   -Continue current regimen - DC Klonopin and trial of Ativan for now -Patient not wanting PICC line replaced yet    Post gastric sleeve surgery Vitamin D deficiency -Continue supplementation with B1, B12, thiamine   Mild transaminitis/elevated levels in the setting of nausea and vomiting.  CT abdomen pelvis on 1/2 with negative finding.  Chest x-ray unremarkable.   Hypokalemia resolved Bipolar 1 disorder continue home meds.   Old records reviewed in assessment of this patient  Antimicrobials: Vancomycin 01/11/2023 >> 01/13/2023 Flagyl 01/10/2023 >> 01/14/2023 Cefepime 01/10/2023 >> current  DVT prophylaxis:  SCDs Start: 01/11/23 0646   Code Status:   Code Status: Full Code  Mobility Assessment (last 72 hours)     Mobility Assessment     Row Name 01/15/23 0745 01/14/23 2228 01/14/23 0830 01/13/23 2050     Does patient have an order for bedrest or is patient medically unstable No - Continue assessment No - Continue assessment No - Continue assessment No - Continue assessment    What is the highest level of mobility based on the progressive mobility assessment? Level 4 (Walks with assist in room) - Balance while marching in place and cannot step forward and back - Complete Level 4 (Walks with  assist in room) - Balance while marching in place and cannot step forward and back - Complete Level 4 (Walks with assist in room) - Balance while marching in place and cannot step forward and back - Complete Level 4 (Walks with assist in room) - Balance while  marching in place and cannot step forward and back - Complete             Barriers to discharge:  Disposition Plan: Home Status is: Inpatient  Objective: Blood pressure 111/74, pulse (!) 50, temperature (!) 97.5 F (36.4 C), temperature source Oral, resp. rate 16, height 5\' 5"  (1.651 m), weight 68 kg, last menstrual period 12/26/2022, SpO2 98 %.  Examination:  Physical Exam Constitutional:      General: She is not in acute distress.    Appearance: She is not ill-appearing.  HENT:     Head: Normocephalic and atraumatic.     Mouth/Throat:     Mouth: Mucous membranes are moist.  Eyes:     Extraocular Movements: Extraocular movements intact.  Cardiovascular:     Rate and Rhythm: Normal rate and regular rhythm.  Pulmonary:     Effort: Pulmonary effort is normal.     Breath sounds: Normal breath sounds.  Abdominal:     General: Bowel sounds are normal. There is no distension.     Palpations: Abdomen is soft.     Comments: Generalized tenderness but worse in left upper quadrant, no rebound or guarding  Musculoskeletal:        General: Normal range of motion.     Cervical back: Normal range of motion and neck supple.  Skin:    General: Skin is warm and dry.  Neurological:     General: No focal deficit present.     Mental Status: She is alert.  Psychiatric:        Mood and Affect: Mood normal.      Consultants:    Procedures:    Data Reviewed: Results for orders placed or performed during the hospital encounter of 01/10/23 (from the past 24 hour(s))  Comprehensive metabolic panel     Status: Abnormal   Collection Time: 01/15/23  6:36 AM  Result Value Ref Range   Sodium 141 135 - 145 mmol/L   Potassium 2.6 (LL) 3.5 - 5.1 mmol/L   Chloride 107 98 - 111 mmol/L   CO2 26 22 - 32 mmol/L   Glucose, Bld 119 (H) 70 - 99 mg/dL   BUN 6 6 - 20 mg/dL   Creatinine, Ser 0.59 0.44 - 1.00 mg/dL   Calcium 8.0 (L) 8.9 - 10.3 mg/dL   Total Protein 4.6 (L) 6.5 - 8.1 g/dL    Albumin 2.5 (L) 3.5 - 5.0 g/dL   AST 21 15 - 41 U/L   ALT 19 0 - 44 U/L   Alkaline Phosphatase 37 (L) 38 - 126 U/L   Total Bilirubin 0.6 0.3 - 1.2 mg/dL   GFR, Estimated >60 >60 mL/min   Anion gap 8 5 - 15  CBC     Status: Abnormal   Collection Time: 01/15/23  6:36 AM  Result Value Ref Range   WBC 5.7 4.0 - 10.5 K/uL   RBC 3.15 (L) 3.87 - 5.11 MIL/uL   Hemoglobin 10.0 (L) 12.0 - 15.0 g/dL   HCT 28.2 (L) 36.0 - 46.0 %   MCV 89.5 80.0 - 100.0 fL   MCH 31.7 26.0 - 34.0 pg   MCHC 35.5 30.0 - 36.0 g/dL  RDW 14.3 11.5 - 15.5 %   Platelets 133 (L) 150 - 400 K/uL   nRBC 0.0 0.0 - 0.2 %  Magnesium     Status: None   Collection Time: 01/15/23  6:36 AM  Result Value Ref Range   Magnesium 1.8 1.7 - 2.4 mg/dL    I have reviewed pertinent nursing notes, vitals, labs, and images as necessary. I have ordered labwork to follow up on as indicated.  I have reviewed the last notes from staff over past 24 hours. I have discussed patient's care plan and test results with nursing staff, CM/SW, and other staff as appropriate.  Time spent: Greater than 50% of the 55 minute visit was spent in counseling/coordination of care for the patient as laid out in the A&P.   LOS: 4 days   Lewie Chamber, MD [facility] 01/15/2023, 6:49 PM

## 2023-01-15 NOTE — Assessment & Plan Note (Signed)
The of lipase and amylase.  Pending results

## 2023-01-15 NOTE — Assessment & Plan Note (Signed)
  Will attempt to obtain records referral for GI being considered as well as infectious disease

## 2023-01-16 ENCOUNTER — Other Ambulatory Visit (HOSPITAL_COMMUNITY): Payer: Self-pay

## 2023-01-16 DIAGNOSIS — A419 Sepsis, unspecified organism: Secondary | ICD-10-CM

## 2023-01-16 DIAGNOSIS — R112 Nausea with vomiting, unspecified: Secondary | ICD-10-CM | POA: Diagnosis not present

## 2023-01-16 DIAGNOSIS — R6521 Severe sepsis with septic shock: Secondary | ICD-10-CM

## 2023-01-16 DIAGNOSIS — T80219A Unspecified infection due to central venous catheter, initial encounter: Secondary | ICD-10-CM

## 2023-01-16 LAB — CBC WITH DIFFERENTIAL/PLATELET
Abs Immature Granulocytes: 0.54 10*3/uL — ABNORMAL HIGH (ref 0.00–0.07)
Basophils Absolute: 0.1 10*3/uL (ref 0.0–0.1)
Basophils Relative: 1 %
Eosinophils Absolute: 0.1 10*3/uL (ref 0.0–0.5)
Eosinophils Relative: 1 %
HCT: 29.2 % — ABNORMAL LOW (ref 36.0–46.0)
Hemoglobin: 10.1 g/dL — ABNORMAL LOW (ref 12.0–15.0)
Immature Granulocytes: 7 %
Lymphocytes Relative: 27 %
Lymphs Abs: 2.2 10*3/uL (ref 0.7–4.0)
MCH: 31.7 pg (ref 26.0–34.0)
MCHC: 34.6 g/dL (ref 30.0–36.0)
MCV: 91.5 fL (ref 80.0–100.0)
Monocytes Absolute: 0.7 10*3/uL (ref 0.1–1.0)
Monocytes Relative: 8 %
Neutro Abs: 4.6 10*3/uL (ref 1.7–7.7)
Neutrophils Relative %: 56 %
Platelets: 159 10*3/uL (ref 150–400)
RBC: 3.19 MIL/uL — ABNORMAL LOW (ref 3.87–5.11)
RDW: 13.9 % (ref 11.5–15.5)
WBC: 8.2 10*3/uL (ref 4.0–10.5)
nRBC: 0 % (ref 0.0–0.2)

## 2023-01-16 LAB — BASIC METABOLIC PANEL
Anion gap: 7 (ref 5–15)
BUN: <5 mg/dL — ABNORMAL LOW (ref 6–20)
CO2: 29 mmol/L (ref 22–32)
Calcium: 8 mg/dL — ABNORMAL LOW (ref 8.9–10.3)
Chloride: 102 mmol/L (ref 98–111)
Creatinine, Ser: 0.46 mg/dL (ref 0.44–1.00)
GFR, Estimated: >60 mL/min (ref >60)
Glucose, Bld: 101 mg/dL — ABNORMAL HIGH (ref 70–99)
Potassium: 2.3 mmol/L — CL (ref 3.5–5.1)
Sodium: 138 mmol/L (ref 135–145)

## 2023-01-16 LAB — ZINC: Zinc: 58 ug/dL (ref 44–115)

## 2023-01-16 LAB — COPPER, SERUM: Copper: 81 ug/dL (ref 80–158)

## 2023-01-16 LAB — VITAMIN C: Vitamin C: 0.8 mg/dL (ref 0.4–2.0)

## 2023-01-16 LAB — MAGNESIUM: Magnesium: 1.8 mg/dL (ref 1.7–2.4)

## 2023-01-16 MED ORDER — POTASSIUM CHLORIDE 10 MEQ/100ML IV SOLN
10.0000 meq | INTRAVENOUS | Status: AC
  Administered 2023-01-16 (×3): 10 meq via INTRAVENOUS
  Filled 2023-01-16 (×4): qty 100

## 2023-01-16 MED ORDER — POTASSIUM CHLORIDE 10 MEQ/100ML IV SOLN
10.0000 meq | INTRAVENOUS | Status: AC
  Administered 2023-01-16 – 2023-01-17 (×3): 10 meq via INTRAVENOUS
  Filled 2023-01-16 (×2): qty 100

## 2023-01-16 MED ORDER — PROCHLORPERAZINE EDISYLATE 10 MG/2ML IJ SOLN
10.0000 mg | Freq: Four times a day (QID) | INTRAMUSCULAR | Status: DC | PRN
  Administered 2023-01-16 (×2): 10 mg via INTRAVENOUS
  Filled 2023-01-16 (×2): qty 2

## 2023-01-16 MED ORDER — SCOPOLAMINE 1 MG/3DAYS TD PT72
1.0000 | MEDICATED_PATCH | TRANSDERMAL | Status: DC
  Filled 2023-01-16: qty 1

## 2023-01-16 MED ORDER — MAGNESIUM SULFATE 2 GM/50ML IV SOLN
2.0000 g | Freq: Once | INTRAVENOUS | Status: AC
  Administered 2023-01-16: 2 g via INTRAVENOUS
  Filled 2023-01-16: qty 50

## 2023-01-16 MED ORDER — LORAZEPAM 1 MG PO TABS
1.0000 mg | ORAL_TABLET | Freq: Two times a day (BID) | ORAL | 0 refills | Status: AC
  Filled 2023-01-16: qty 10, 5d supply, fill #0

## 2023-01-16 MED ORDER — PROMETHAZINE HCL 25 MG PO TABS
25.0000 mg | ORAL_TABLET | ORAL | 0 refills | Status: AC | PRN
  Filled 2023-01-16 – 2023-01-17 (×2): qty 30, 5d supply, fill #0

## 2023-01-16 NOTE — Plan of Care (Signed)
  Problem: Activity: °Goal: Risk for activity intolerance will decrease °Outcome: Not Progressing °  °Problem: Nutrition: °Goal: Adequate nutrition will be maintained °Outcome: Not Progressing °  °Problem: Coping: °Goal: Level of anxiety will decrease °Outcome: Not Progressing °  °Problem: Pain Managment: °Goal: General experience of comfort will improve °Outcome: Not Progressing °  °

## 2023-01-16 NOTE — Progress Notes (Signed)
Progress Note    Victoria Andrews   CZY:[id number]  DOB: Dec 06, 1996  DOA: 01/10/2023     5 PCP: Mort Sawyers, FNP  Initial CC: Nausea/vomiting  Hospital Course: Victoria Andrews is a 27 yo female w/ BPD, gastric sleeve surgery done in 2016 with no postop follow-up who has had multiple hospitalizations and admissions for severe malnutrition secondary to cyclical vomiting and nausea. Recently hospitalized at [facility] from 12/19 - 1/3 for similar findings, had extensive workup including evaluations by gastroenterology with EGD which was unremarkable as well as psychiatry and trials of Haldol, Reglan, Phenergan, Compazine, Ativan and scopolamine with mild improvement.   Patient had a PICC line placed and started on TPN starting 12/25 ongoing.  She continued to have dry heaving and vomiting.  She presented to ED 1/12 with abdominal pain, rigors and fever and was admitted for septic shock. Workup unrevealing for source but felt to be secondary to PICC line. PICC line discontinued and patient placed on antibiotics plus pressor support and weaned off of pressors.   Interval History:  No events overnight.  States that the Ativan helped since being added yesterday and her nausea is improved some today.  She wants to try and taken some liquids if able.  Mother present bedside as well this morning and update given with questions answered.  Assessment and Plan:  Septic shock POA-resolved Suspected to have PICC line infection: Presented tachycardia tachypnea persistent hypotension despite IV fluids needing pressor support in ICU.  Source felt to be PICC line; blood cultures negative.  PICC line removed.   - Blood culture from admission negative. MRSA PCR screen negative  - on cefepime/Flagyl.  Vancomycin discontinued.  -Hydrocortisone being tapered off -De-escalate down to monotherapy cefepime for now.  Will plan to complete 7-day course cefepime during hospitalization  Diarrhea - patient complaining  of watery green stool starting 1/16 - does have prior hx Cdiff - given clinical scenario, check Cdiff and GI panel   Intractable nausea vomiting On TPN for inadequate oral intake: Has had extensive workup elsewhere; EGD and seen by psychiatry with trials of Haldol Reglan Phenergan Compazine Ativan and scopolamine patch.   -Continue current regimen - DC Klonopin and trial of Ativan for now -Patient not wanting PICC line replaced yet    Post gastric sleeve surgery Vitamin D deficiency -Continue supplementation with B1, B12, thiamine   Mild transaminitis/elevated levels in the setting of nausea and vomiting.  CT abdomen pelvis on 1/2 with negative finding.  Chest x-ray unremarkable.   Hypokalemia resolved Bipolar 1 disorder continue home meds.   Old records reviewed in assessment of this patient  Antimicrobials: Vancomycin 01/11/2023 >> 01/13/2023 Flagyl 01/10/2023 >> 01/14/2023 Cefepime 01/10/2023 >> current  DVT prophylaxis:  SCDs Start: 01/11/23 0646   Code Status:   Code Status: Full Code  Mobility Assessment (last 72 hours)     Mobility Assessment     Row Name 01/16/23 0900 01/15/23 0745 01/14/23 2228 01/14/23 0830 01/13/23 2050   Does patient have an order for bedrest or is patient medically unstable No - Continue assessment No - Continue assessment No - Continue assessment No - Continue assessment No - Continue assessment   What is the highest level of mobility based on the progressive mobility assessment? Level 4 (Walks with assist in room) - Balance while marching in place and cannot step forward and back - Complete Level 4 (Walks with assist in room) - Balance while marching in place and cannot step forward and back -  Complete Level 4 (Walks with assist in room) - Balance while marching in place and cannot step forward and back - Complete Level 4 (Walks with assist in room) - Balance while marching in place and cannot step forward and back - Complete Level 4 (Walks with  assist in room) - Balance while marching in place and cannot step forward and back - Complete            Barriers to discharge:  Disposition Plan: Home Status is: Inpatient  Objective: Blood pressure 117/72, pulse (!) 52, temperature 98.4 F (36.9 C), temperature source Oral, resp. rate 16, height 5\' 5"  (1.651 m), weight 68 kg, last menstrual period 12/26/2022, SpO2 99 %.  Examination:  Physical Exam Constitutional:      General: She is not in acute distress.    Appearance: She is not ill-appearing.  HENT:     Head: Normocephalic and atraumatic.     Mouth/Throat:     Mouth: Mucous membranes are moist.  Eyes:     Extraocular Movements: Extraocular movements intact.  Cardiovascular:     Rate and Rhythm: Normal rate and regular rhythm.  Pulmonary:     Effort: Pulmonary effort is normal.     Breath sounds: Normal breath sounds.  Abdominal:     General: Bowel sounds are normal. There is no distension.     Palpations: Abdomen is soft.     Comments: Generalized tenderness but worse in left upper quadrant, no rebound or guarding  Musculoskeletal:        General: Normal range of motion.     Cervical back: Normal range of motion and neck supple.  Skin:    General: Skin is warm and dry.  Neurological:     General: No focal deficit present.     Mental Status: She is alert.  Psychiatric:        Mood and Affect: Mood normal.      Consultants:    Procedures:    Data Reviewed: Results for orders placed or performed during the hospital encounter of 01/10/23 (from the past 24 hour(s))  Basic metabolic panel     Status: Abnormal   Collection Time: 01/16/23  8:17 AM  Result Value Ref Range   Sodium 138 135 - 145 mmol/L   Potassium 2.3 (LL) 3.5 - 5.1 mmol/L   Chloride 102 98 - 111 mmol/L   CO2 29 22 - 32 mmol/L   Glucose, Bld 101 (H) 70 - 99 mg/dL   BUN <5 (L) 6 - 20 mg/dL   Creatinine, Ser 0.46 0.44 - 1.00 mg/dL   Calcium 8.0 (L) 8.9 - 10.3 mg/dL   GFR, Estimated >60  >60 mL/min   Anion gap 7 5 - 15  CBC with Differential/Platelet     Status: Abnormal   Collection Time: 01/16/23  8:17 AM  Result Value Ref Range   WBC 8.2 4.0 - 10.5 K/uL   RBC 3.19 (L) 3.87 - 5.11 MIL/uL   Hemoglobin 10.1 (L) 12.0 - 15.0 g/dL   HCT 29.2 (L) 36.0 - 46.0 %   MCV 91.5 80.0 - 100.0 fL   MCH 31.7 26.0 - 34.0 pg   MCHC 34.6 30.0 - 36.0 g/dL   RDW 13.9 11.5 - 15.5 %   Platelets 159 150 - 400 K/uL   nRBC 0.0 0.0 - 0.2 %   Neutrophils Relative % 56 %   Neutro Abs 4.6 1.7 - 7.7 K/uL   Lymphocytes Relative 27 %   Lymphs Abs  2.2 0.7 - 4.0 K/uL   Monocytes Relative 8 %   Monocytes Absolute 0.7 0.1 - 1.0 K/uL   Eosinophils Relative 1 %   Eosinophils Absolute 0.1 0.0 - 0.5 K/uL   Basophils Relative 1 %   Basophils Absolute 0.1 0.0 - 0.1 K/uL   WBC Morphology MILD LEFT SHIFT (1-5% METAS, OCC MYELO, OCC BANDS)    Smear Review Reviewed    Immature Granulocytes 7 %   Abs Immature Granulocytes 0.54 (H) 0.00 - 0.07 K/uL  Magnesium     Status: None   Collection Time: 01/16/23  8:17 AM  Result Value Ref Range   Magnesium 1.8 1.7 - 2.4 mg/dL    I have reviewed pertinent nursing notes, vitals, labs, and images as necessary. I have ordered labwork to follow up on as indicated.  I have reviewed the last notes from staff over past 24 hours. I have discussed patient's care plan and test results with nursing staff, CM/SW, and other staff as appropriate.  Time spent: Greater than 50% of the 55 minute visit was spent in counseling/coordination of care for the patient as laid out in the A&P.   LOS: 5 days   Dwyane Dee, MD [facility] 01/16/2023, 4:44 PM

## 2023-01-17 ENCOUNTER — Other Ambulatory Visit (HOSPITAL_COMMUNITY): Payer: Self-pay

## 2023-01-17 DIAGNOSIS — R6521 Severe sepsis with septic shock: Secondary | ICD-10-CM | POA: Diagnosis not present

## 2023-01-17 DIAGNOSIS — A419 Sepsis, unspecified organism: Secondary | ICD-10-CM | POA: Diagnosis not present

## 2023-01-17 DIAGNOSIS — T80219A Unspecified infection due to central venous catheter, initial encounter: Secondary | ICD-10-CM | POA: Diagnosis not present

## 2023-01-17 DIAGNOSIS — R112 Nausea with vomiting, unspecified: Secondary | ICD-10-CM | POA: Diagnosis not present

## 2023-01-17 LAB — CBC WITH DIFFERENTIAL/PLATELET
Abs Immature Granulocytes: 0.88 10*3/uL — ABNORMAL HIGH (ref 0.00–0.07)
Basophils Absolute: 0 10*3/uL (ref 0.0–0.1)
Basophils Relative: 0 %
Eosinophils Absolute: 0.2 10*3/uL (ref 0.0–0.5)
Eosinophils Relative: 2 %
HCT: 31.1 % — ABNORMAL LOW (ref 36.0–46.0)
Hemoglobin: 10.9 g/dL — ABNORMAL LOW (ref 12.0–15.0)
Immature Granulocytes: 9 %
Lymphocytes Relative: 27 %
Lymphs Abs: 2.8 10*3/uL (ref 0.7–4.0)
MCH: 31.9 pg (ref 26.0–34.0)
MCHC: 35 g/dL (ref 30.0–36.0)
MCV: 90.9 fL (ref 80.0–100.0)
Monocytes Absolute: 0.8 10*3/uL (ref 0.1–1.0)
Monocytes Relative: 8 %
Neutro Abs: 5.5 10*3/uL (ref 1.7–7.7)
Neutrophils Relative %: 54 %
Platelets: 201 10*3/uL (ref 150–400)
RBC: 3.42 MIL/uL — ABNORMAL LOW (ref 3.87–5.11)
RDW: 13.9 % (ref 11.5–15.5)
WBC: 10.1 10*3/uL (ref 4.0–10.5)
nRBC: 0 % (ref 0.0–0.2)

## 2023-01-17 LAB — BASIC METABOLIC PANEL
Anion gap: 6 (ref 5–15)
BUN: <5 mg/dL — ABNORMAL LOW (ref 6–20)
CO2: 27 mmol/L (ref 22–32)
Calcium: 8 mg/dL — ABNORMAL LOW (ref 8.9–10.3)
Chloride: 107 mmol/L (ref 98–111)
Creatinine, Ser: 0.53 mg/dL (ref 0.44–1.00)
GFR, Estimated: >60 mL/min (ref >60)
Glucose, Bld: 90 mg/dL (ref 70–99)
Potassium: 2.9 mmol/L — ABNORMAL LOW (ref 3.5–5.1)
Sodium: 140 mmol/L (ref 135–145)

## 2023-01-17 LAB — MAGNESIUM: Magnesium: 2.4 mg/dL (ref 1.7–2.4)

## 2023-01-17 LAB — VITAMIN B6: Vitamin B6: 3.8 ug/L (ref 3.4–65.2)

## 2023-01-17 MED ORDER — LORAZEPAM 2 MG/ML IJ SOLN
1.0000 mg | Freq: Once | INTRAMUSCULAR | Status: AC
  Administered 2023-01-17: 1 mg via INTRAVENOUS
  Filled 2023-01-17: qty 1

## 2023-01-17 MED ORDER — SODIUM CHLORIDE 0.9 % IV SOLN
25.0000 mg | INTRAVENOUS | Status: AC
  Administered 2023-01-17: 25 mg via INTRAVENOUS
  Filled 2023-01-17: qty 1

## 2023-01-17 MED ORDER — POTASSIUM CHLORIDE 10 MEQ/100ML IV SOLN
10.0000 meq | INTRAVENOUS | Status: AC
  Filled 2023-01-17: qty 100

## 2023-01-17 MED ORDER — PROCHLORPERAZINE MALEATE 10 MG PO TABS
10.0000 mg | ORAL_TABLET | Freq: Four times a day (QID) | ORAL | 0 refills | Status: AC | PRN
  Filled 2023-01-17: qty 20, 5d supply, fill #0

## 2023-01-17 NOTE — Progress Notes (Signed)
Patient given IV meds per order before discharge.  Concerns and needs addressed.  Patient's father providing transportation.  Patient discharged home.

## 2023-01-17 NOTE — H&P (Signed)
Patient and mother Victoria Andrews verbalized understanding of dc instructions.

## 2023-01-17 NOTE — Progress Notes (Signed)
Patient refused potassium IV due to pain.  Patient unable to tolerate.  Patient is discharging home today.  Dr. Sabino Gasser saw patient and made aware.

## 2023-01-17 NOTE — Discharge Summary (Signed)
Physician Discharge Summary   Victoria Andrews GDJ:[id number] DOB: [birth year] DOA: 01/10/2023  PCP: Eugenia Pancoast, FNP  Admit date: 01/10/2023 Discharge date: 01/17/2023  Barriers to discharge:   Admitted From: Home Disposition:  Home Discharging physician: Dwyane Dee, MD  Recommendations for Outpatient Follow-up:  Follow up with [facility] Patient declined PICC re-placement until seen by [facility]   Discharge Condition: stable CODE STATUS: Full Diet recommendation:  Diet Orders (From admission, onward)     Start     Ordered   01/17/23 0000  Diet general        01/17/23 1032   01/13/23 1945  Diet regular Room service appropriate? Yes; Fluid consistency: Thin  Diet effective now       Question Answer Comment  Room service appropriate? Yes   Fluid consistency: Thin      01/13/23 1946            Hospital Course: [name] is a 27 yo female w/ BPD, gastric sleeve surgery done in 2016 with no postop follow-up who has had multiple hospitalizations and admissions for severe malnutrition secondary to cyclical vomiting and nausea. Recently hospitalized at [place] from 12/19 - 1/3 for similar findings, had extensive workup including evaluations by gastroenterology with EGD which was unremarkable as well as psychiatry and trials of Haldol, Reglan, Phenergan, Compazine, Ativan and scopolamine with mild improvement.   Patient had a PICC line placed and started on TPN starting 12/25 ongoing.  She continued to have dry heaving and vomiting.  She presented to ED 1/12 with abdominal pain, rigors and fever and was admitted for septic shock. Workup unrevealing for source but felt to be secondary to PICC line. PICC line discontinued and patient placed on antibiotics plus pressor support and weaned off of pressors.  She completed 7-day course of cefepime during hospitalization.  Assessment and Plan:  Septic shock POA-resolved Suspected PICC line infection - Presented with tachycardia, tachypnea,  persistent hypotension despite IV fluids needing pressor support in ICU.  Source felt to be PICC line; blood cultures negative.  PICC line removed.   - Blood culture from admission negative. MRSA PCR screen negative  - on cefepime/Flagyl.  Vancomycin discontinued -Hydrocortisone tapered off -De-escalated down to monotherapy cefepime for now. Completed 7 day course in hospital     Diarrhea - patient complaining of watery green stool starting 1/16 - does have prior hx Cdiff - once stool studies ordered, patient seemed to have no more diarrhea; no specimen collected   Intractable nausea vomiting On TPN for inadequate oral intake: Has had extensive workup including EGD and seen by psychiatry with trials of Haldol Reglan Phenergan Compazine Ativan and scopolamine patch - DC Klonopin and trial of Ativan for now - Patient not wanting PICC line replaced until seen by [facility]    Post gastric sleeve surgery Vitamin D deficiency -Continue supplementation with B1, B12, thiamine   Mild transaminitis/elevated levels in the setting of nausea and vomiting.  CT abdomen pelvis on 1/2 with negative finding.  Chest x-ray unremarkable.   Hypokalemia resolved Bipolar 1 disorder continue home meds.    Principal Diagnosis: Septic shock [facility])  Discharge Diagnoses: Active Hospital Problems   Diagnosis Date Noted   On total parenteral nutrition (TPN) 01/11/2023    Priority: 3.   Intractable nausea and vomiting 01/11/2023    Priority: 4.   Hypokalemia 01/11/2023    Priority: 10.   Bipolar 1 disorder, mixed ([place]) 01/10/2023    Priority: 15.   Diarrhea 01/15/2023  Protein-calorie malnutrition (HCC) 01/11/2023    Resolved Hospital Problems   Diagnosis Date Noted Date Resolved   Septic shock [facility](HCC) 01/11/2023 01/13/2023    Priority: 1.   Infection of peripherally inserted central venous catheter (PICC) 01/11/2023 01/17/2023    Priority: 2.     Discharge Instructions     Amb Referral to Nutrition  and Diabetic Education   Complete by: As directed    Diet general   Complete by: As directed    Increase activity slowly   Complete by: As directed       Allergies as of 01/17/2023       Reactions   Ciprofloxacin Nausea And Vomiting   Ondansetron Nausea And Vomiting   Other Hives   Pt allergic to dermabond        Medication List     STOP taking these medications    clonazePAM 0.5 MG tablet Commonly known as: KLONOPIN       TAKE these medications    CALCIUM GLUCONATE IV Inject 8.998 mEq into the vein daily. TPN: 8.98096mEq   CHROMIUM CHLORIDE IV Inject 10 mEq into the vein daily. TPM: 2910mEq/day   Clinisol 15 % Soln Inject 85.001 g into the vein daily. TPN: 85.001g/day   dextrose 70 % solution Inject 214.998 g into the vein daily. TPN: 214.998g/day   dronabinol 5 MG capsule Commonly known as: MARINOL Take 5 mg by mouth 2 (two) times daily.   escitalopram 10 MG tablet Commonly known as: LEXAPRO Take 10 mg by mouth daily.   famotidine 20 MG tablet Commonly known as: PEPCID Take 20 mg by mouth daily as needed for heartburn or indigestion.   loperamide 2 MG capsule Commonly known as: IMODIUM Take 2 mg by mouth as needed for diarrhea or loose stools.   LORazepam 1 MG tablet Commonly known as: Ativan Take 1 tablet (1 mg total) by mouth 2 (two) times daily for 5 days.   MAGNESIUM SULFATE IJ Inject 14.981 mEq into the vein daily. TPN: 14.94981mEq/day   mirtazapine 15 MG tablet Commonly known as: REMERON Take 15 mg by mouth daily.   NONFORMULARY OR COMPOUNDED ITEM 20.45 g by Continuous infusion (non-IV) route daily. Clinicolipid TPN: 20.45g/day   POTASSIUM CHLORIDE IV Inject 38 mEq into the vein daily. TPN: 5038mEq/day   POTASSIUM PHOSPHATES IV Inject 18.998 mEq into the vein daily. TPN: 18.94798mEq/day   prochlorperazine 10 MG tablet Commonly known as: COMPAZINE Take 1 tablet (10 mg total) by mouth every 6 (six) hours as needed for nausea or vomiting.    promethazine 25 MG tablet Commonly known as: PHENERGAN Take 1 tablet (25 mg total) by mouth every 4 (four) hours as needed for nausea or vomiting.   scopolamine 1 MG/3DAYS Commonly known as: TRANSDERM-SCOP Place 1 patch onto the skin every 3 (three) days.   SODIUM CHLORIDE IV Inject 77.001 mEq into the vein daily. TPN: 77.05401mEq/day   sterile water injection 849.22 mLs by Intravenous (Continuous Infusion) route daily. TPN: 849.29220ml/day   TRALEMENT IV Inject 1 mL into the vein daily. TPN: 831ml/day   traZODone 50 MG tablet Commonly known as: DESYREL Take 1 tablet by mouth at bedtime.        Allergies  Allergen Reactions   Ciprofloxacin Nausea And Vomiting   Ondansetron Nausea And Vomiting   Other Hives    Pt allergic to dermabond    Discharge Exam: BP (!) 94/52 (BP Location: Left Arm)   Pulse 83   Temp 98.3 F (36.8 C) (Oral)  Resp 18   Ht 5\' 5"  (1.651 m)   Wt 68 kg   LMP 12/26/2022   SpO2 99%   BMI 24.96 kg/m  Physical Exam Constitutional:      General: She is not in acute distress.    Appearance: She is not ill-appearing.  HENT:     Head: Normocephalic and atraumatic.     Mouth/Throat:     Mouth: Mucous membranes are moist.  Eyes:     Extraocular Movements: Extraocular movements intact.  Cardiovascular:     Rate and Rhythm: Normal rate and regular rhythm.  Pulmonary:     Effort: Pulmonary effort is normal.     Breath sounds: Normal breath sounds.  Abdominal:     General: Bowel sounds are normal. There is no distension.     Palpations: Abdomen is soft.     Comments: Generalized tenderness but worse in left upper quadrant, no rebound or guarding  Musculoskeletal:        General: Normal range of motion.     Cervical back: Normal range of motion and neck supple.  Skin:    General: Skin is warm and dry.  Neurological:     General: No focal deficit present.     Mental Status: She is alert.  Psychiatric:        Mood and Affect: Mood normal.       The results of significant diagnostics from this hospitalization (including imaging, microbiology, ancillary and laboratory) are listed below for reference.   Microbiology: Recent Results (from the past 240 hour(s))  Resp panel by RT-PCR (RSV, Flu A&B, Covid) Urine, Clean Catch     Status: None   Collection Time: 01/10/23  6:28 PM   Specimen: Urine, Clean Catch; Nasal Swab  Result Value Ref Range Status   SARS Coronavirus 2 by RT PCR NEGATIVE NEGATIVE Final    Comment: (NOTE) SARS-CoV-2 target nucleic acids are NOT DETECTED.  The SARS-CoV-2 RNA is generally detectable in upper respiratory specimens during the acute phase of infection. The lowest concentration of SARS-CoV-2 viral copies this assay can detect is 138 copies/mL. A negative result does not preclude SARS-Cov-2 infection and should not be used as the sole basis for treatment or other patient management decisions. A negative result may occur with  improper specimen collection/handling, submission of specimen other than nasopharyngeal swab, presence of viral mutation(s) within the areas targeted by this assay, and inadequate number of viral copies(<138 copies/mL). A negative result must be combined with clinical observations, patient history, and epidemiological information. The expected result is Negative.  Fact Sheet for Patients:  03/11/23  Fact Sheet for Healthcare Providers:  BloggerCourse.com  This test is no t yet approved or cleared by the [place] FDA and  has been authorized for detection and/or diagnosis of SARS-CoV-2 by FDA under an Emergency Use Authorization (EUA). This EUA will remain  in effect (meaning this test can be used) for the duration of the COVID-19 declaration under Section 564(b)(1) of the Act, 21 U.S.C.section 360bbb-3(b)(1), unless the authorization is terminated  or revoked sooner.       Influenza A by PCR NEGATIVE NEGATIVE  Final   Influenza B by PCR NEGATIVE NEGATIVE Final    Comment: (NOTE) The Xpert Xpress SARS-CoV-2/FLU/RSV plus assay is intended as an aid in the diagnosis of influenza from Nasopharyngeal swab specimens and should not be used as a sole basis for treatment. Nasal washings and aspirates are unacceptable for Xpert Xpress SARS-CoV-2/FLU/RSV testing.  Fact Sheet for  Patients: BloggerCourse.com  Fact Sheet for Healthcare Providers: SeriousBroker.it  This test is not yet approved or cleared by the [place] FDA and has been authorized for detection and/or diagnosis of SARS-CoV-2 by FDA under an Emergency Use Authorization (EUA). This EUA will remain in effect (meaning this test can be used) for the duration of the COVID-19 declaration under Section 564(b)(1) of the Act, 21 U.S.C. section 360bbb-3(b)(1), unless the authorization is terminated or revoked.     Resp Syncytial Virus by PCR NEGATIVE NEGATIVE Final    Comment: (NOTE) Fact Sheet for Patients: BloggerCourse.com  Fact Sheet for Healthcare Providers: SeriousBroker.it  This test is not yet approved or cleared by the [place] FDA and has been authorized for detection and/or diagnosis of SARS-CoV-2 by FDA under an Emergency Use Authorization (EUA). This EUA will remain in effect (meaning this test can be used) for the duration of the COVID-19 declaration under Section 564(b)(1) of the Act, 21 U.S.C. section 360bbb-3(b)(1), unless the authorization is terminated or revoked.  Performed at [facility], [address], [place], [place] [address]   Urine Culture     Status: Abnormal   Collection Time: 01/10/23  6:28 PM   Specimen: In/Out Cath Urine  Result Value Ref Range Status   Specimen Description IN/OUT CATH URINE  Final   Special Requests   Final    NONE Performed at [facility], [address] [place], [place] [address]    Culture MULTIPLE SPECIES PRESENT, SUGGEST RECOLLECTION (A)  Final   Report Status 01/13/2023 FINAL  Final  Blood Culture (routine x 2)     Status: None   Collection Time: 01/10/23  7:33 PM   Specimen: BLOOD RIGHT ARM  Result Value Ref Range Status   Specimen Description BLOOD RIGHT ARM  Final   Special Requests   Final    BOTTLES DRAWN AEROBIC AND ANAEROBIC Blood Culture adequate volume   Culture   Final    NO GROWTH 5 DAYS Performed at [facility], [address], [place], [place] [address]    Report Status 01/15/2023 FINAL  Final  Blood Culture (routine x 2)     Status: None   Collection Time: 01/10/23  7:48 PM   Specimen: BLOOD RIGHT HAND  Result Value Ref Range Status   Specimen Description BLOOD RIGHT HAND  Final   Special Requests   Final    BOTTLES DRAWN AEROBIC ONLY Blood Culture adequate volume   Culture   Final    NO GROWTH 5 DAYS Performed at [facility], [address] [place], [place] [address]    Report Status 01/15/2023 FINAL  Final  MRSA Next Gen by PCR, Nasal     Status: None   Collection Time: 01/11/23  9:51 PM   Specimen: Nasal Mucosa; Nasal Swab  Result Value Ref Range Status   MRSA by PCR Next Gen NOT DETECTED NOT DETECTED Final    Comment: (NOTE) The GeneXpert MRSA Assay (FDA approved for NASAL specimens only), is one component of a comprehensive MRSA colonization surveillance program. It is not intended to diagnose MRSA infection nor to guide or monitor treatment for MRSA infections. Test performance is not FDA approved in patients less than 110 years old. Performed at [facility], [address], [place], [place] [address]      Labs: BNP (last 3 results) No results for input(s): "BNP" in the last 8760 hours. Basic Metabolic Panel: Recent Labs  Lab 01/12/23 0813 01/13/23 0013 01/15/23 0636 01/16/23 0817 01/17/23 0411  NA 137 139 141 138 140  K 2.9* 3.6 2.6* 2.3* 2.9*  CL 108 110 107 102  107  CO2 19* 20* 26 29 27   GLUCOSE 114* 138* 119* 101* 90  BUN 6 <5* 6 <5* <5*  CREATININE 0.61 0.52 0.59 0.46 0.53  CALCIUM 7.8* 8.2* 8.0* 8.0* 8.0*  MG  --  1.7 1.8 1.8 2.4  PHOS  --  2.5  --   --   --    Liver Function Tests: Recent Labs  Lab 01/10/23 1948 01/11/23 0422 01/12/23 0813 01/13/23 0013 01/15/23 0636  AST 23 21 33 24 21  ALT 25 19 24 21 19   ALKPHOS 62 31* 41 41 37*  BILITOT 1.4* 0.9 1.3* 0.8 0.6  PROT 6.9 3.2* 4.9* 4.9* 4.6*  ALBUMIN 3.6 1.7* 2.7* 2.7* 2.5*   Recent Labs  Lab 01/10/23 1948 01/12/23 0813 01/13/23 0013  LIPASE 201* 118* 80*   No results for input(s): "AMMONIA" in the last 168 hours. CBC: Recent Labs  Lab 01/10/23 1948 01/11/23 0422 01/12/23 0813 01/13/23 0013 01/15/23 0636 01/16/23 0817 01/17/23 0411  WBC 7.6   < > 3.9* 5.4 5.7 8.2 10.1  NEUTROABS 6.5  --   --   --   --  4.6 5.5  HGB 14.5   < > 11.5* 10.8* 10.0* 10.1* 10.9*  HCT 42.2   < > 32.8* 30.6* 28.2* 29.2* 31.1*  MCV 92.7   < > 91.6 91.9 89.5 91.5 90.9  PLT 181   < > 99* 104* 133* 159 201   < > = values in this interval not displayed.   Cardiac Enzymes: Recent Labs  Lab 01/11/23 0836 01/13/23 1308  CKTOTAL 224 71   BNP: Invalid input(s): "POCBNP" CBG: Recent Labs  Lab 01/11/23 1752 01/12/23 0313  GLUCAP 84 91   D-Dimer No results for input(s): "DDIMER" in the last 72 hours. Hgb A1c No results for input(s): "HGBA1C" in the last 72 hours. Lipid Profile No results for input(s): "CHOL", "HDL", "LDLCALC", "TRIG", "CHOLHDL", "LDLDIRECT" in the last 72 hours. Thyroid function studies No results for input(s): "TSH", "T4TOTAL", "T3FREE", "THYROIDAB" in the last 72 hours.  Invalid input(s): "FREET3" Anemia work up No results for input(s): "VITAMINB12", "FOLATE", "FERRITIN", "TIBC", "IRON", "RETICCTPCT" in the last 72 hours. Urinalysis    Component Value Date/Time   COLORURINE AMBER (A) 01/10/2023 1828   APPEARANCEUR HAZY (A) 01/10/2023 1828   LABSPEC 1.027  01/10/2023 1828   PHURINE 5.0 01/10/2023 1828   GLUCOSEU NEGATIVE 01/10/2023 1828   HGBUR NEGATIVE 01/10/2023 1828   BILIRUBINUR NEGATIVE 01/10/2023 1828   KETONESUR 5 (A) 01/10/2023 1828   PROTEINUR 100 (A) 01/10/2023 1828   NITRITE NEGATIVE 01/10/2023 1828   LEUKOCYTESUR NEGATIVE 01/10/2023 1828   Sepsis Labs Recent Labs  Lab 01/13/23 0013 01/15/23 0636 01/16/23 0817 01/17/23 0411  WBC 5.4 5.7 8.2 10.1   Microbiology Recent Results (from the past 240 hour(s))  Resp panel by RT-PCR (RSV, Flu A&B, Covid) Urine, Clean Catch     Status: None   Collection Time: 01/10/23  6:28 PM   Specimen: Urine, Clean Catch; Nasal Swab  Result Value Ref Range Status   SARS Coronavirus 2 by RT PCR NEGATIVE NEGATIVE Final    Comment: (NOTE) SARS-CoV-2 target nucleic acids are NOT DETECTED.  The SARS-CoV-2 RNA is generally detectable in upper respiratory specimens during the acute phase of infection. The lowest concentration of SARS-CoV-2 viral  copies this assay can detect is 138 copies/mL. A negative result does not preclude SARS-Cov-2 infection and should not be used as the sole basis for treatment or other patient management decisions. A negative result may occur with  improper specimen collection/handling, submission of specimen other than nasopharyngeal swab, presence of viral mutation(s) within the areas targeted by this assay, and inadequate number of viral copies(<138 copies/mL). A negative result must be combined with clinical observations, patient history, and epidemiological information. The expected result is Negative.  Fact Sheet for Patients:  BloggerCourse.comhttps://www.fda.gov/media/152166/download  Fact Sheet for Healthcare Providers:  SeriousBroker.ithttps://www.fda.gov/media/152162/download  This test is no t yet approved or cleared by the [place] FDA and  has been authorized for detection and/or diagnosis of SARS-CoV-2 by FDA under an Emergency Use Authorization (EUA). This EUA will remain   in effect (meaning this test can be used) for the duration of the COVID-19 declaration under Section 564(b)(1) of the Act, 21 U.S.C.section 360bbb-3(b)(1), unless the authorization is terminated  or revoked sooner.       Influenza A by PCR NEGATIVE NEGATIVE Final   Influenza B by PCR NEGATIVE NEGATIVE Final    Comment: (NOTE) The Xpert Xpress SARS-CoV-2/FLU/RSV plus assay is intended as an aid in the diagnosis of influenza from Nasopharyngeal swab specimens and should not be used as a sole basis for treatment. Nasal washings and aspirates are unacceptable for Xpert Xpress SARS-CoV-2/FLU/RSV testing.  Fact Sheet for Patients: BloggerCourse.comhttps://www.fda.gov/media/152166/download  Fact Sheet for Healthcare Providers: SeriousBroker.ithttps://www.fda.gov/media/152162/download  This test is not yet approved or cleared by the [place] FDA and has been authorized for detection and/or diagnosis of SARS-CoV-2 by FDA under an Emergency Use Authorization (EUA). This EUA will remain in effect (meaning this test can be used) for the duration of the COVID-19 declaration under Section 564(b)(1) of the Act, 21 U.S.C. section 360bbb-3(b)(1), unless the authorization is terminated or revoked.     Resp Syncytial Virus by PCR NEGATIVE NEGATIVE Final    Comment: (NOTE) Fact Sheet for Patients: BloggerCourse.comhttps://www.fda.gov/media/152166/download  Fact Sheet for Healthcare Providers: SeriousBroker.ithttps://www.fda.gov/media/152162/download  This test is not yet approved or cleared by the [place] FDA and has been authorized for detection and/or diagnosis of SARS-CoV-2 by FDA under an Emergency Use Authorization (EUA). This EUA will remain in effect (meaning this test can be used) for the duration of the COVID-19 declaration under Section 564(b)(1) of the Act, 21 U.S.C. section 360bbb-3(b)(1), unless the authorization is terminated or revoked.  Performed at [facility], [address] [place], [place] [address]   Urine  Culture     Status: Abnormal   Collection Time: 01/10/23  6:28 PM   Specimen: In/Out Cath Urine  Result Value Ref Range Status   Specimen Description IN/OUT CATH URINE  Final   Special Requests   Final    NONE Performed at [facility], [address] [place], [place] [address]    Culture MULTIPLE SPECIES PRESENT, SUGGEST RECOLLECTION (A)  Final   Report Status 01/13/2023 FINAL  Final  Blood Culture (routine x 2)     Status: None   Collection Time: 01/10/23  7:33 PM   Specimen: BLOOD RIGHT ARM  Result Value Ref Range Status   Specimen Description BLOOD RIGHT ARM  Final   Special Requests   Final    BOTTLES DRAWN AEROBIC AND ANAEROBIC Blood Culture adequate volume   Culture   Final    NO GROWTH 5 DAYS Performed at [facility], [address] [place], [place] [address]    Report Status 01/15/2023 FINAL  Final  Blood Culture (routine x 2)     Status: None   Collection Time: 01/10/23  7:48 PM   Specimen: BLOOD RIGHT HAND  Result Value Ref Range Status   Specimen Description BLOOD RIGHT HAND  Final   Special Requests   Final    BOTTLES DRAWN AEROBIC ONLY Blood Culture adequate volume   Culture   Final    NO GROWTH 5 DAYS Performed at [facility], [place] [address] [place], [place] [address]    Report Status 01/15/2023 FINAL  Final  MRSA Next Gen by PCR, Nasal     Status: None   Collection Time: 01/11/23  9:51 PM   Specimen: Nasal Mucosa; Nasal Swab  Result Value Ref Range Status   MRSA by PCR Next Gen NOT DETECTED NOT DETECTED Final    Comment: (NOTE) The GeneXpert MRSA Assay (FDA approved for NASAL specimens only), is one component of a comprehensive MRSA colonization surveillance program. It is not intended to diagnose MRSA infection nor to guide or monitor treatment for MRSA infections. Test performance is not FDA approved in patients less than 34 years old. Performed at [facility], [place] [address] [place],  [address]      Procedures/Studies: ECHOCARDIOGRAM LIMITED  Result Date: 01/11/2023    ECHOCARDIOGRAM LIMITED REPORT   Patient Name:   Victoria Andrews Date of Exam: 01/11/2023 Medical Rec #:  [id number]       Height:       65.0 in Accession #:    [id number]      Weight:       150.0 lb Date of Birth:  September 03, 1996       BSA:          1.750 m Patient Age:    26 years        BP:           101/71 mmHg Patient Gender: F               HR:           78 bpm. Exam Location:  Inpatient Procedure: Limited Echo, Color Doppler and Cardiac Doppler Indications:    Shock ([place]) [[id number]]  History:        Patient has no prior history of Echocardiogram examinations.                 Sepsis, Signs/Symptoms:Hypotension; Risk Factors:Non-Smoker.  Sonographer:    Greer Pickerel Referring Phys: [id number] GRACE E BOWSER  Sonographer Comments: Image acquisition challenging due to respiratory motion. IMPRESSIONS  1. Abnormal septal motion . Left ventricular ejection fraction, by estimation, is 50 to 55%. The left ventricle has low normal function. The left ventricle has no regional wall motion abnormalities. Left ventricular diastolic parameters were normal.  2. Right ventricular systolic function is normal. The right ventricular size is normal.  3. The mitral valve is normal in structure. No evidence of mitral valve regurgitation. No evidence of mitral stenosis.  4. The aortic valve is normal in structure. Aortic valve regurgitation is not visualized. No aortic stenosis is present.  5. The inferior vena cava is normal in size with greater than 50% respiratory variability, suggesting right atrial pressure of 3 mmHg. FINDINGS  Left Ventricle: Abnormal septal motion. Left ventricular ejection fraction, by estimation, is 50 to 55%. The left ventricle has low normal function. The left ventricle has no regional wall motion abnormalities.  The left ventricular internal cavity size was normal in size. There is no left ventricular hypertrophy. Left ventricular  diastolic parameters were normal. Right Ventricle: The right ventricular size is normal. No increase in right ventricular wall thickness. Right ventricular systolic function is normal. Left Atrium: Left atrial size was normal in size. Right Atrium: Right atrial size was normal in size. Pericardium: There is no evidence of pericardial effusion. Mitral Valve: The mitral valve is normal in structure. No evidence of mitral valve stenosis. Tricuspid Valve: The tricuspid valve is normal in structure. Tricuspid valve regurgitation is not demonstrated. No evidence of tricuspid stenosis. Aortic Valve: The aortic valve is normal in structure. Aortic valve regurgitation is not visualized. No aortic stenosis is present. Pulmonic Valve: The pulmonic valve was normal in structure. Pulmonic valve regurgitation is not visualized. No evidence of pulmonic stenosis. Aorta: The aortic root is normal in size and structure. Venous: The inferior vena cava is normal in size with greater than 50% respiratory variability, suggesting right atrial pressure of 3 mmHg. IAS/Shunts: No atrial level shunt detected by color flow Doppler. Additional Comments: Spectral Doppler performed. Color Doppler performed.  LEFT VENTRICLE PLAX 2D LVIDd:         4.50 cm LVIDs:         2.90 cm LV PW:         0.80 cm LV IVS:        0.70 cm  LEFT ATRIUM         Index LA diam:    2.50 cm 1.43 cm/m Charlton Haws MD Electronically signed by Charlton Haws MD Signature Date/Time: 01/11/2023/9:32:36 AM    Final    CT ABDOMEN PELVIS W CONTRAST  Result Date: 01/10/2023 CLINICAL DATA:  Abdominal pain EXAM: CT ABDOMEN AND PELVIS WITH CONTRAST TECHNIQUE: Multidetector CT imaging of the abdomen and pelvis was performed using the standard protocol following bolus administration of intravenous contrast. RADIATION DOSE REDUCTION: This exam was performed according to the departmental dose-optimization program which includes automated exposure control, adjustment of the mA and/or  kV according to patient size and/or use of iterative reconstruction technique. CONTRAST:  7mL OMNIPAQUE IOHEXOL 350 MG/ML SOLN COMPARISON:  12/03/2022 FINDINGS: Lower chest: No acute abnormality Hepatobiliary: No focal liver abnormality is seen. Status post cholecystectomy. No biliary dilatation. Pancreas: No focal abnormality or ductal dilatation. Spleen: No focal abnormality.  Normal size. Adrenals/Urinary Tract: Adrenal glands normal. 1.6 cm cyst in the upper pole of the left kidney, stable. No follow-up imaging recommended. No stones or hydronephrosis. Urinary bladder unremarkable. Stomach/Bowel: Normal appendix. Stomach, large and small bowel grossly unremarkable. Vascular/Lymphatic: No evidence of aneurysm or adenopathy. Reproductive: Uterus and adnexa unremarkable.  No mass. Other: No free fluid or free air. Musculoskeletal: No acute bony abnormality. IMPRESSION: No acute findings in the abdomen or pelvis. Electronically Signed   By: Charlett Nose M.D.   On: 01/10/2023 21:19   DG Chest Port 1 View  Result Date: 01/10/2023 CLINICAL DATA:  Questionable sepsis - evaluate for abnormality. Right chest pain and fever. EXAM: PORTABLE CHEST 1 VIEW COMPARISON:  12/03/2022 CT FINDINGS: Right PICC line in place with the tip at the cavoatrial junction. Heart and mediastinal contours are within normal limits. No focal opacities or effusions. No acute bony abnormality. IMPRESSION: No active disease. Electronically Signed   By: Charlett Nose M.D.   On: 01/10/2023 20:18     Time coordinating discharge: Over 30 minutes    Lewie Chamber, MD  [facility] 01/17/2023, 2:32 PM

## 2023-01-18 DIAGNOSIS — R112 Nausea with vomiting, unspecified: Secondary | ICD-10-CM | POA: Diagnosis not present

## 2023-01-20 ENCOUNTER — Telehealth: Payer: Self-pay

## 2023-01-20 NOTE — Telephone Encounter (Signed)
Transition Care Management Follow-up Telephone Call Date of discharge and from where: [facility] 1.19.24 How have you been since you were released from the hospital? [facility] guess Any questions or concerns? No  Items Reviewed: Did the pt receive and understand the discharge instructions provided? Yes  Medications obtained and verified? Yes  Other? Yes  Any new allergies since your discharge? No  Dietary orders reviewed? Yes Do you have support at home? Yes    Functional Questionnaire: (I = Independent and D = Dependent) ADLs: I  Bathing/Dressing- I  Meal Prep- D  Eating- I  Maintaining continence- I  Transferring/Ambulation- I  Managing Meds- I  Follow up appointments reviewed:  PCP Hospital f/u appt confirmed? Yes  Scheduled to see Dr. Tobin Chad on 1/23 @ 145. Are transportation arrangements needed? Yes  If their condition worsens, is the pt aware to call PCP or go to the Emergency Dept.? Yes Was the patient provided with contact information for the PCP's office or ED? Yes Was to pt encouraged to call back with questions or concerns? Yes

## 2023-01-21 DIAGNOSIS — R112 Nausea with vomiting, unspecified: Secondary | ICD-10-CM | POA: Diagnosis not present

## 2023-01-22 ENCOUNTER — Telehealth: Payer: Self-pay | Admitting: Family

## 2023-01-22 NOTE — Telephone Encounter (Signed)
Spoke with Mom and patient was advised that there is no order for IV Fluids, just a walk in. Patient was informed to also try to see if the office she received her labs can call [facility] to advise that she would be coming for therapy. Patient understood. She will call back to schedule for Hospital F/U.

## 2023-01-22 NOTE — Telephone Encounter (Signed)
Patient mom Victoria Andrews called in and stated that she need to know where she can get IV Fluids from. She stated that Victoria Andrews was in the hospital. Informed she will more than likely need a hospital follow up but didn't want to schedule yet. She can be reached at [phone]. Please advise. Thank you!

## 2023-01-22 NOTE — Telephone Encounter (Signed)
Spoke with Mom and patient. Pt had blood work drawn yesterday. She does need IV fluids, based on her lab results. She don't need TPN because she is drinking. Will she need an order to get the IV fluids from [facility] or do she have to sit in the ER and wait to be seen as a walk in? She is not in any pain at the moment.

## 2023-01-23 NOTE — Telephone Encounter (Signed)
Pt needs a hospital f/u.  If she is dehydrated then she needs to the hospital.

## 2023-01-24 ENCOUNTER — Emergency Department
Admission: EM | Admit: 2023-01-24 | Discharge: 2023-01-24 | Disposition: A | Discharge status: Home / Self Care | Payer: BC Managed Care – PPO | Attending: Emergency Medicine | Admitting: Emergency Medicine

## 2023-01-24 DIAGNOSIS — R1031 Right lower quadrant pain: Secondary | ICD-10-CM | POA: Insufficient documentation

## 2023-01-24 DIAGNOSIS — R9431 Abnormal electrocardiogram [ECG] [EKG]: Secondary | ICD-10-CM | POA: Diagnosis not present

## 2023-01-24 DIAGNOSIS — R001 Bradycardia, unspecified: Secondary | ICD-10-CM | POA: Diagnosis not present

## 2023-01-24 DIAGNOSIS — R1115 Cyclical vomiting syndrome unrelated to migraine: Secondary | ICD-10-CM | POA: Insufficient documentation

## 2023-01-24 DIAGNOSIS — R1011 Right upper quadrant pain: Secondary | ICD-10-CM | POA: Diagnosis not present

## 2023-01-24 DIAGNOSIS — R1111 Vomiting without nausea: Secondary | ICD-10-CM | POA: Diagnosis not present

## 2023-01-24 DIAGNOSIS — E876 Hypokalemia: Secondary | ICD-10-CM | POA: Insufficient documentation

## 2023-01-24 DIAGNOSIS — R112 Nausea with vomiting, unspecified: Secondary | ICD-10-CM | POA: Diagnosis not present

## 2023-01-24 DIAGNOSIS — R42 Dizziness and giddiness: Secondary | ICD-10-CM | POA: Diagnosis not present

## 2023-01-24 DIAGNOSIS — R110 Nausea: Secondary | ICD-10-CM | POA: Diagnosis not present

## 2023-01-24 LAB — COMPREHENSIVE METABOLIC PANEL
ALT: 49 U/L — ABNORMAL HIGH (ref 0–44)
AST: 30 U/L (ref 15–41)
Albumin: 3.7 g/dL (ref 3.5–5.0)
Alkaline Phosphatase: 55 U/L (ref 38–126)
Anion gap: 14 (ref 5–15)
BUN: 12 mg/dL (ref 6–20)
CO2: 20 mmol/L — ABNORMAL LOW (ref 22–32)
Calcium: 8.6 mg/dL — ABNORMAL LOW (ref 8.9–10.3)
Chloride: 104 mmol/L (ref 98–111)
Creatinine, Ser: 0.5 mg/dL (ref 0.44–1.00)
GFR, Estimated: >60 mL/min (ref >60)
Glucose, Bld: 91 mg/dL (ref 70–99)
Potassium: 2.7 mmol/L — CL (ref 3.5–5.1)
Sodium: 138 mmol/L (ref 135–145)
Total Bilirubin: 1.7 mg/dL — ABNORMAL HIGH (ref 0.3–1.2)
Total Protein: 6.2 g/dL — ABNORMAL LOW (ref 6.5–8.1)

## 2023-01-24 LAB — CBC
HCT: 35.5 % — ABNORMAL LOW (ref 36.0–46.0)
Hemoglobin: 12.1 g/dL (ref 12.0–15.0)
MCH: 31.3 pg (ref 26.0–34.0)
MCHC: 34.1 g/dL (ref 30.0–36.0)
MCV: 92 fL (ref 80.0–100.0)
Platelets: 275 10*3/uL (ref 150–400)
RBC: 3.86 MIL/uL — ABNORMAL LOW (ref 3.87–5.11)
RDW: 14.7 % (ref 11.5–15.5)
WBC: 9.8 10*3/uL (ref 4.0–10.5)
nRBC: 0 % (ref 0.0–0.2)

## 2023-01-24 LAB — POC URINE PREG, ED: Preg Test, Ur: NEGATIVE

## 2023-01-24 LAB — URINALYSIS, ROUTINE W REFLEX MICROSCOPIC
Bacteria, UA: NONE SEEN
Bilirubin Urine: NEGATIVE
Glucose, UA: NEGATIVE mg/dL
Hgb urine dipstick: NEGATIVE
Ketones, ur: 80 mg/dL — AB
Leukocytes,Ua: NEGATIVE
Nitrite: NEGATIVE
Protein, ur: 30 mg/dL — AB
Specific Gravity, Urine: 1.025 (ref 1.005–1.030)
pH: 6 (ref 5.0–8.0)

## 2023-01-24 LAB — MAGNESIUM: Magnesium: 1.8 mg/dL (ref 1.7–2.4)

## 2023-01-24 LAB — LIPASE, BLOOD: Lipase: 118 U/L — ABNORMAL HIGH (ref 11–51)

## 2023-01-24 LAB — PHOSPHORUS: Phosphorus: 1.1 mg/dL — ABNORMAL LOW (ref 2.5–4.6)

## 2023-01-24 MED ORDER — POTASSIUM CHLORIDE 10 MEQ/100ML IV SOLN
10.0000 meq | INTRAVENOUS | Status: DC
  Filled 2023-01-24 (×2): qty 100

## 2023-01-24 MED ORDER — POTASSIUM CHLORIDE 10 MEQ/100ML IV SOLN
10.0000 meq | INTRAVENOUS | Status: AC
  Administered 2023-01-24 (×2): 10 meq via INTRAVENOUS

## 2023-01-24 MED ORDER — POTASSIUM PHOSPHATES 15 MMOLE/5ML IV SOLN
45.0000 mmol | Freq: Once | INTRAVENOUS | Status: AC
  Administered 2023-01-24: 45 mmol via INTRAVENOUS
  Filled 2023-01-24: qty 15

## 2023-01-24 MED ORDER — CALCIUM GLUCONATE-NACL 1-0.675 GM/50ML-% IV SOLN
1.0000 g | Freq: Once | INTRAVENOUS | Status: AC
  Administered 2023-01-24: 1000 mg via INTRAVENOUS
  Filled 2023-01-24: qty 50

## 2023-01-24 MED ORDER — SODIUM CHLORIDE 0.9 % IV BOLUS
1000.0000 mL | Freq: Once | INTRAVENOUS | Status: AC
  Administered 2023-01-24: 1000 mL via INTRAVENOUS

## 2023-01-24 MED ORDER — SODIUM CHLORIDE 0.9 % IV SOLN
12.5000 mg | Freq: Four times a day (QID) | INTRAVENOUS | Status: DC | PRN
  Administered 2023-01-24: 12.5 mg via INTRAVENOUS
  Filled 2023-01-24: qty 12.5

## 2023-01-24 MED ORDER — LORAZEPAM 2 MG/ML IJ SOLN
0.5000 mg | Freq: Once | INTRAMUSCULAR | Status: AC
  Administered 2023-01-24: 0.5 mg via INTRAVENOUS
  Filled 2023-01-24: qty 1

## 2023-01-24 MED ORDER — SODIUM CHLORIDE 0.9 % IV SOLN: Freq: Once | INTRAVENOUS | Status: AC

## 2023-01-24 MED ORDER — POTASSIUM CHLORIDE 10 MEQ/100ML IV SOLN
10.0000 meq | INTRAVENOUS | Status: AC
  Administered 2023-01-24: 10 meq via INTRAVENOUS
  Filled 2023-01-24 (×2): qty 100

## 2023-01-24 MED ORDER — DROPERIDOL 2.5 MG/ML IJ SOLN
2.5000 mg | Freq: Once | INTRAMUSCULAR | Status: AC
  Administered 2023-01-24: 2.5 mg via INTRAVENOUS
  Filled 2023-01-24: qty 2

## 2023-01-24 NOTE — ED Provider Notes (Signed)
[facility] Provider Note    Event Date/Time   First MD Initiated Contact with Patient 01/24/23 [phone]     (approximate)   History   No chief complaint on file.   HPI  Victoria Andrews is a 27 y.o. female   Past medical history of click vomiting with malnutrition requiring PICC line and past complicated by PICC line infection last year with removal of PICC line recent discharge home who presents to the emergency department with recurrent nausea, vomiting, poor p.o. intake and abdominal pain.  Discharge she had several days where she felt well and had no symptoms and was eating.  For the last several days she has had nausea and return of symptoms that have been ailing her for the last several months.  She has some mild right-sided abdominal pain which is chronic and associated with her vomiting that she is experienced in the past, has had CT scans of the abdomen without surgical pathologies.  Independent Historian contributed to assessment above: Mother  External Medical Documents Reviewed: Discharge summary from 01/17/2023 for septic shock suspected PICC line infection      Physical Exam   Triage Vital Signs: ED Triage Vitals  Enc Vitals Group     BP 01/24/23 0850 (!) 108/91     Pulse Rate 01/24/23 0850 72     Resp 01/24/23 0850 18     Temp 01/24/23 0850 98.5 F (36.9 C)     Temp Source 01/24/23 0850 Oral     SpO2 01/24/23 0850 99 %     Weight 01/24/23 0851 150 lb (68 kg)     Height 01/24/23 0851 5\' 5"  (1.651 m)     Head Circumference --      Peak Flow --      Pain Score 01/24/23 0850 7     Pain Loc --      Pain Edu? --      Excl. in [place]? --     Most recent vital signs: Vitals:   01/24/23 1500 01/24/23 1530  BP: 109/67 103/64  Pulse: 64 (!) 58  Resp: 17 17  Temp:    SpO2: 100% 100%    General: Awake, no distress.  CV:  Good peripheral perfusion.  Resp:  Normal effort.  Abd:  No distention.  Other:  Awake alert oriented normal  hemodynamics, nontoxic-appearing, abdomen is soft but she reports mild tenderness to palpation in the right side upper and lower quadrants   ED Results / Procedures / Treatments   Labs (all labs ordered are listed, but only abnormal results are displayed) Labs Reviewed  LIPASE, BLOOD - Abnormal; Notable for the following components:      Result Value   Lipase 118 (*)    All other components within normal limits  COMPREHENSIVE METABOLIC PANEL - Abnormal; Notable for the following components:   Potassium 2.7 (*)    CO2 20 (*)    Calcium 8.6 (*)    Total Protein 6.2 (*)    ALT 49 (*)    Total Bilirubin 1.7 (*)    All other components within normal limits  CBC - Abnormal; Notable for the following components:   RBC 3.86 (*)    HCT 35.5 (*)    All other components within normal limits  URINALYSIS, ROUTINE W REFLEX MICROSCOPIC - Abnormal; Notable for the following components:   Color, Urine AMBER (*)    APPearance HAZY (*)    Ketones, ur 80 (*)  Protein, ur 30 (*)    All other components within normal limits  PHOSPHORUS - Abnormal; Notable for the following components:   Phosphorus 1.1 (*)    All other components within normal limits  MAGNESIUM  PHOSPHORUS  POC URINE PREG, ED     I ordered and reviewed the above labs they are notable for phosphorus is low at 1.1 as well as hypokalemia 2.7  EKG  ED ECG REPORT I, Pilar Jarvis, the attending physician, personally viewed and interpreted this ECG.   Date: 01/24/2023  EKG Time: 1028  Rate: 59  Rhythm: nsr  Axis: nl  Intervals:none  ST&T Change: no acute ischemia   PROCEDURES:  Critical Care performed: Yes, see critical care procedure note(s)  .Critical Care  Performed by: Pilar Jarvis, MD Authorized by: Pilar Jarvis, MD   Critical care provider statement:    Critical care time (minutes):  30   Critical care was necessary to treat or prevent imminent or life-threatening deterioration of the following conditions:   Dehydration   Critical care was time spent personally by me on the following activities:  Development of treatment plan with patient or surrogate, discussions with consultants, evaluation of patient's response to treatment, examination of patient, ordering and review of laboratory studies, ordering and review of radiographic studies, ordering and performing treatments and interventions, pulse oximetry, re-evaluation of patient's condition and review of old charts    MEDICATIONS ORDERED IN ED: Medications  potassium PHOSPHATE 45 mmol in dextrose 5 % 500 mL infusion (45 mmol Intravenous New Bag/Given 01/24/23 1135)  potassium chloride 10 mEq in 100 mL IVPB (10 mEq Intravenous New Bag/Given 01/24/23 1558)  potassium chloride 10 mEq in 100 mL IVPB (0 mEq Intravenous Stopped 01/24/23 1549)  promethazine (PHENERGAN) 12.5 mg in sodium chloride 0.9 % 50 mL IVPB (0 mg Intravenous Stopped 01/24/23 1548)  calcium gluconate 1 g/ 50 mL sodium chloride IVPB (0 mg Intravenous Stopped 01/24/23 1322)  sodium chloride 0.9 % bolus 1,000 mL (0 mLs Intravenous Stopped 01/24/23 1321)  droperidol (INAPSINE) 2.5 MG/ML injection 2.5 mg (2.5 mg Intravenous Given 01/24/23 1046)  sodium chloride 0.9 % bolus 1,000 mL (0 mLs Intravenous Stopped 01/24/23 1322)  0.9 %  sodium chloride infusion ( Intravenous New Bag/Given 01/24/23 1314)     IMPRESSION / MDM / ASSESSMENT AND PLAN / ED COURSE  I reviewed the triage vital signs and the nursing notes.                                Patient's presentation is most consistent with acute presentation with potential threat to life or bodily function.  Differential diagnosis includes, but is not limited to, vomiting, electrolyte derangement, dehydration, intra-abdominal infection   MDM: cyclic vomiting abdominal pain and hypokalemia hypophosphatemia getting IV hydration electrolyte repletion. Abdominal exam with some mild tenderness to the right side that she states is chronic and  unchanged, had a CT scan on 01/10/2023 without acute findings I discussed my recommendation for repeat CT scan but patient declines at this time stating that her pain is chronic would like to defer imaging.  Her pain is improved with some hydration on recheck.  He is stable getting IV fluids and IV electrolyte repletion at the time of my signout, after medications this patient would like to be discharged and has follow-up established at [facility] for her cyclic vomiting.        FINAL CLINICAL IMPRESSION(S) / ED  DIAGNOSES   Final diagnoses:  Cyclical vomiting     Rx / DC Orders   ED Discharge Orders     None        Note:  This document was prepared using Dragon voice recognition software and may include unintentional dictation errors.    Lucillie Garfinkel, MD 01/24/23 [phone]

## 2023-01-24 NOTE — ED Notes (Signed)
Pharmacy called to adjust  rate. Patient c/o burning at IV site. Rate changed to 46ml/hr. Dr. Jacelyn Grip aware. IV consulted for 2nd IV.

## 2023-01-24 NOTE — ED Notes (Signed)
Patient denies pain at IV site that has potassium infusing. Patient states she feels better. Patient's color has improved. Patient given ice chips per Dr. Jacelyn Grip. Mother at bedside.

## 2023-01-24 NOTE — ED Provider Notes (Signed)
Patient is requesting discharge so she can go to [facility], she is stable for discharge at this time   Lavonia Drafts, MD 01/24/23 1736

## 2023-01-24 NOTE — ED Notes (Signed)
Patient given two warm blankets for comfort. Patient c/o tremors and muscle cramping in right hand. Dr. Jacelyn Grip aware. Both complaints have resolved.

## 2023-01-24 NOTE — ED Notes (Signed)
Patient c/o nausea. Dr. Jacelyn Grip aware.

## 2023-01-24 NOTE — ED Notes (Signed)
IV team at bedside 

## 2023-01-24 NOTE — ED Notes (Signed)
Patient is able to tolerate ice intermittently.

## 2023-01-24 NOTE — ED Notes (Signed)
Patient states she hasn't voided a normal amount in the last two days due to poor PO intake due to vomiting.

## 2023-01-24 NOTE — ED Notes (Signed)
Patient denies nausea at this time. Patient states she can tolerate small amounts of ice.

## 2023-01-24 NOTE — ED Notes (Signed)
Patient c/o pain from potassium. Another RN decrease rate to 17ml/hr for comfort. Patient is c/o "dry heaving" per mother. Will inform provider.

## 2023-01-24 NOTE — ED Triage Notes (Signed)
Pt to ED via [facility] from home. Pt reports N/V, HA and decreased appetites. Pt seen on 1/12 for same. Pt reports symptoms go away and then will come back 4mg  zofran. 20g LAC. Pt denies CP or SOB. Pt recently had PICC line removed and was receiving TPN nutrition. Pt also reports numbness bilaterally in hands and on both sides of face.   EMS VS: BP 123/76 HR 64 CBG 95 997% RA

## 2023-01-24 NOTE — ED Notes (Signed)
Patient states she couldn't tolerate the infusions any longer. Potassium CL and Potassium phosphate stopped. Dr. Corky Downs aware

## 2023-01-27 ENCOUNTER — Telehealth: Payer: Self-pay

## 2023-01-27 DIAGNOSIS — E860 Dehydration: Secondary | ICD-10-CM | POA: Diagnosis not present

## 2023-01-27 DIAGNOSIS — F4310 Post-traumatic stress disorder, unspecified: Secondary | ICD-10-CM | POA: Diagnosis not present

## 2023-01-27 DIAGNOSIS — K5900 Constipation, unspecified: Secondary | ICD-10-CM | POA: Diagnosis not present

## 2023-01-27 DIAGNOSIS — E876 Hypokalemia: Secondary | ICD-10-CM | POA: Diagnosis not present

## 2023-01-27 DIAGNOSIS — R112 Nausea with vomiting, unspecified: Secondary | ICD-10-CM | POA: Diagnosis not present

## 2023-01-27 DIAGNOSIS — F419 Anxiety disorder, unspecified: Secondary | ICD-10-CM | POA: Diagnosis not present

## 2023-01-27 DIAGNOSIS — E43 Unspecified severe protein-calorie malnutrition: Secondary | ICD-10-CM | POA: Diagnosis not present

## 2023-01-27 DIAGNOSIS — Z6825 Body mass index (BMI) 25.0-25.9, adult: Secondary | ICD-10-CM | POA: Diagnosis not present

## 2023-01-27 DIAGNOSIS — R1115 Cyclical vomiting syndrome unrelated to migraine: Secondary | ICD-10-CM | POA: Diagnosis not present

## 2023-01-27 DIAGNOSIS — Z9884 Bariatric surgery status: Secondary | ICD-10-CM | POA: Diagnosis not present

## 2023-01-27 DIAGNOSIS — F319 Bipolar disorder, unspecified: Secondary | ICD-10-CM | POA: Diagnosis not present

## 2023-01-27 DIAGNOSIS — G4700 Insomnia, unspecified: Secondary | ICD-10-CM | POA: Diagnosis not present

## 2023-01-27 NOTE — Telephone Encounter (Signed)
Transition Care Management Unsuccessful Follow-up Telephone Call  Date of discharge and from where:  [facility] 0-80-22 Dx: cylical vomitting   Attempts:  1st Attempt  Reason for unsuccessful TCM follow-up call:  Missing or invalid number   [place] Direct Dial [phone]

## 2023-01-28 DIAGNOSIS — R112 Nausea with vomiting, unspecified: Secondary | ICD-10-CM | POA: Diagnosis not present

## 2023-01-28 NOTE — Telephone Encounter (Signed)
Transition Care Management Unsuccessful Follow-up Telephone Call  Date of discharge and from where:    [facility] 09-01-39 Dx: cylical vomitting     Attempts:  2nd Attempt  Reason for unsuccessful TCM follow-up call:  Unable to reach patient   Juanda Crumble LPN [place] Direct Dial [phone]

## 2023-01-29 DIAGNOSIS — R112 Nausea with vomiting, unspecified: Secondary | ICD-10-CM | POA: Diagnosis not present

## 2023-01-29 NOTE — Telephone Encounter (Signed)
Transition Care Management Unsuccessful Follow-up Telephone Call  Date of discharge and from where:  [facility] 9-38-10 Dx: cylical vomitting   Attempts:  3rd Attempt  Reason for unsuccessful TCM follow-up call:  Left voice message   Juanda Crumble LPN [place] Direct Dial [phone]

## 2023-01-31 ENCOUNTER — Telehealth: Payer: Self-pay | Admitting: Family

## 2023-01-31 NOTE — Telephone Encounter (Signed)
Hailey called from [facility] called and stated patient wants IV fluids outpatient and they don't set it up there. Call back number [phone]. Or [facility] MEDU team [phone].

## 2023-02-03 ENCOUNTER — Telehealth: Payer: Self-pay

## 2023-02-03 NOTE — Telephone Encounter (Signed)
Unable to reach patient. Left voicemail to return call to our office.   

## 2023-02-03 NOTE — Telephone Encounter (Signed)
Transition Care Management Unsuccessful Follow-up Telephone Call TCM DC [facility] 01-31-23 Dx: intractable n/v Date of discharge and from where:    Attempts:  1st Attempt  Reason for unsuccessful TCM follow-up call:  Left voice message   Juanda Crumble LPN [place] Direct Dial [phone]

## 2023-02-04 DIAGNOSIS — Z87442 Personal history of urinary calculi: Secondary | ICD-10-CM | POA: Diagnosis not present

## 2023-02-04 DIAGNOSIS — K2100 Gastro-esophageal reflux disease with esophagitis, without bleeding: Secondary | ICD-10-CM | POA: Diagnosis not present

## 2023-02-04 DIAGNOSIS — Z881 Allergy status to other antibiotic agents status: Secondary | ICD-10-CM | POA: Diagnosis not present

## 2023-02-04 DIAGNOSIS — N390 Urinary tract infection, site not specified: Secondary | ICD-10-CM | POA: Diagnosis not present

## 2023-02-04 DIAGNOSIS — E43 Unspecified severe protein-calorie malnutrition: Secondary | ICD-10-CM | POA: Diagnosis not present

## 2023-02-04 DIAGNOSIS — K56699 Other intestinal obstruction unspecified as to partial versus complete obstruction: Secondary | ICD-10-CM | POA: Diagnosis not present

## 2023-02-04 DIAGNOSIS — R1110 Vomiting, unspecified: Secondary | ICD-10-CM | POA: Diagnosis not present

## 2023-02-04 DIAGNOSIS — R627 Adult failure to thrive: Secondary | ICD-10-CM | POA: Diagnosis not present

## 2023-02-04 DIAGNOSIS — E860 Dehydration: Secondary | ICD-10-CM | POA: Diagnosis not present

## 2023-02-04 DIAGNOSIS — F319 Bipolar disorder, unspecified: Secondary | ICD-10-CM | POA: Diagnosis not present

## 2023-02-04 DIAGNOSIS — K56609 Unspecified intestinal obstruction, unspecified as to partial versus complete obstruction: Secondary | ICD-10-CM | POA: Diagnosis not present

## 2023-02-04 DIAGNOSIS — E46 Unspecified protein-calorie malnutrition: Secondary | ICD-10-CM | POA: Diagnosis not present

## 2023-02-04 DIAGNOSIS — K209 Esophagitis, unspecified without bleeding: Secondary | ICD-10-CM | POA: Diagnosis not present

## 2023-02-04 DIAGNOSIS — Z23 Encounter for immunization: Secondary | ICD-10-CM | POA: Diagnosis not present

## 2023-02-04 DIAGNOSIS — R6339 Other feeding difficulties: Secondary | ICD-10-CM | POA: Diagnosis not present

## 2023-02-04 DIAGNOSIS — K9419 Other complications of enterostomy: Secondary | ICD-10-CM | POA: Diagnosis not present

## 2023-02-04 DIAGNOSIS — F419 Anxiety disorder, unspecified: Secondary | ICD-10-CM | POA: Diagnosis not present

## 2023-02-04 DIAGNOSIS — Z6823 Body mass index (BMI) 23.0-23.9, adult: Secondary | ICD-10-CM | POA: Diagnosis not present

## 2023-02-04 DIAGNOSIS — R112 Nausea with vomiting, unspecified: Secondary | ICD-10-CM | POA: Diagnosis not present

## 2023-02-04 DIAGNOSIS — Z903 Acquired absence of stomach [part of]: Secondary | ICD-10-CM | POA: Diagnosis not present

## 2023-02-04 DIAGNOSIS — Z934 Other artificial openings of gastrointestinal tract status: Secondary | ICD-10-CM | POA: Diagnosis not present

## 2023-02-04 DIAGNOSIS — Z9049 Acquired absence of other specified parts of digestive tract: Secondary | ICD-10-CM | POA: Diagnosis not present

## 2023-02-04 DIAGNOSIS — K319 Disease of stomach and duodenum, unspecified: Secondary | ICD-10-CM | POA: Diagnosis not present

## 2023-02-04 NOTE — Telephone Encounter (Signed)
Transition Care Management Unsuccessful Follow-up Telephone Call  Date of discharge and from where:  TCM DC [facility] 01-31-23 Dx: intractable n/v      Attempts:  2nd Attempt  Reason for unsuccessful TCM follow-up call:  Left voice message  Pt has fu appt with [facility] on 02-10-23 at 1115am   [place] [phone]

## 2023-02-10 ENCOUNTER — Ambulatory Visit: Payer: BC Managed Care – PPO | Admitting: Skilled Nursing Facility1

## 2023-02-12 NOTE — Telephone Encounter (Signed)
Unable to reach patient. Left voicemail to return call to our office.   

## 2023-02-13 ENCOUNTER — Ambulatory Visit: Payer: Self-pay | Admitting: Family

## 2023-02-14 NOTE — Telephone Encounter (Signed)
Patient was admitted into the hospital and expected to be discharged on Monday 02/17/23.

## 2023-02-18 ENCOUNTER — Telehealth: Payer: Self-pay

## 2023-02-18 DIAGNOSIS — Z9884 Bariatric surgery status: Secondary | ICD-10-CM | POA: Diagnosis not present

## 2023-02-18 DIAGNOSIS — Z87440 Personal history of urinary (tract) infections: Secondary | ICD-10-CM | POA: Diagnosis not present

## 2023-02-18 DIAGNOSIS — Z9181 History of falling: Secondary | ICD-10-CM | POA: Diagnosis not present

## 2023-02-18 DIAGNOSIS — Z934 Other artificial openings of gastrointestinal tract status: Secondary | ICD-10-CM | POA: Diagnosis not present

## 2023-02-18 DIAGNOSIS — Z79899 Other long term (current) drug therapy: Secondary | ICD-10-CM | POA: Diagnosis not present

## 2023-02-18 DIAGNOSIS — E43 Unspecified severe protein-calorie malnutrition: Secondary | ICD-10-CM | POA: Diagnosis not present

## 2023-02-18 NOTE — Transitions of Care (Post Inpatient/ED Visit) (Signed)
   02/18/2023  Name: Victoria Andrews MRN: [id number] DOB: [birth year]  Today's TOC FU Call Status: Today's TOC FU Call Status:: Successful TOC FU Call Competed TOC FU Call Complete Date: 02/18/23  Transition Care Management Follow-up Telephone Call Date of Discharge: 02/17/23 Discharge Facility: Other ([facility]) Name of Other (Non[facility]) Discharge Facility: [facility] Type of Discharge: Inpatient Admission Primary Inpatient Discharge Diagnosis:: tube feed placement How have you been since you were released from the hospital?: Better Any questions or concerns?: No  Items Reviewed: Did you receive and understand the discharge instructions provided?: Yes Medications obtained and verified?: Yes (Medications Reviewed) Any new allergies since your discharge?: No Dietary orders reviewed?: NA Do you have support at home?: Yes  Home Care and Equipment/Supplies: [place] Ordered?: Yes Name of [place]:: [facility] Has Agency set up a time to come to your home?: Yes EMR reviewed for Home Health Orders: Orders present/patient has not received call (refer to CM for follow-up) Any new equipment or medical supplies ordered?: No  Functional Questionnaire: Do you need assistance with bathing/showering or dressing?: No Do you need assistance with meal preparation?: No Do you need assistance with eating?: No Do you have difficulty maintaining continence: No Do you need assistance with getting out of bed/getting out of a chair/moving?: No Do you have difficulty managing or taking your medications?: No  Folllow up appointments reviewed: PCP Follow-up appointment confirmed?: No (pt refused) MD Provider Line [phone] Given: Yes [facility] Follow-up appointment confirmed?: No Reason Specialist Follow-Up Not Confirmed: Patient has Specialist Provider Number and will Call for Appointment Do you need transportation to your follow-up  appointment?: No Do you understand care options if your condition(s) worsen?: Yes-patient verbalized understanding    [place] LPN [place] Direct Dial [phone]

## 2023-03-04 DIAGNOSIS — R112 Nausea with vomiting, unspecified: Secondary | ICD-10-CM | POA: Diagnosis not present

## 2023-03-04 DIAGNOSIS — K909 Intestinal malabsorption, unspecified: Secondary | ICD-10-CM | POA: Diagnosis not present

## 2023-03-04 DIAGNOSIS — Z48815 Encounter for surgical aftercare following surgery on the digestive system: Secondary | ICD-10-CM | POA: Diagnosis not present

## 2023-03-04 DIAGNOSIS — Z79899 Other long term (current) drug therapy: Secondary | ICD-10-CM | POA: Diagnosis not present

## 2023-03-04 DIAGNOSIS — Z9884 Bariatric surgery status: Secondary | ICD-10-CM | POA: Diagnosis not present

## 2023-03-21 DIAGNOSIS — Z934 Other artificial openings of gastrointestinal tract status: Secondary | ICD-10-CM | POA: Diagnosis not present

## 2023-03-21 DIAGNOSIS — Z79899 Other long term (current) drug therapy: Secondary | ICD-10-CM | POA: Diagnosis not present

## 2023-03-21 DIAGNOSIS — Z9884 Bariatric surgery status: Secondary | ICD-10-CM | POA: Diagnosis not present

## 2023-03-21 DIAGNOSIS — Z9181 History of falling: Secondary | ICD-10-CM | POA: Diagnosis not present

## 2023-03-21 DIAGNOSIS — Z87440 Personal history of urinary (tract) infections: Secondary | ICD-10-CM | POA: Diagnosis not present

## 2023-03-21 DIAGNOSIS — E43 Unspecified severe protein-calorie malnutrition: Secondary | ICD-10-CM | POA: Diagnosis not present

## 2023-03-24 DIAGNOSIS — Z934 Other artificial openings of gastrointestinal tract status: Secondary | ICD-10-CM | POA: Diagnosis not present

## 2023-03-24 DIAGNOSIS — E46 Unspecified protein-calorie malnutrition: Secondary | ICD-10-CM | POA: Diagnosis not present

## 2023-03-24 DIAGNOSIS — R112 Nausea with vomiting, unspecified: Secondary | ICD-10-CM | POA: Diagnosis not present

## 2023-03-31 DIAGNOSIS — Z934 Other artificial openings of gastrointestinal tract status: Secondary | ICD-10-CM | POA: Diagnosis not present

## 2023-03-31 DIAGNOSIS — R112 Nausea with vomiting, unspecified: Secondary | ICD-10-CM | POA: Diagnosis not present

## 2023-03-31 DIAGNOSIS — E46 Unspecified protein-calorie malnutrition: Secondary | ICD-10-CM | POA: Diagnosis not present

## 2023-04-01 DIAGNOSIS — Z713 Dietary counseling and surveillance: Secondary | ICD-10-CM | POA: Diagnosis not present

## 2023-04-01 DIAGNOSIS — Z9884 Bariatric surgery status: Secondary | ICD-10-CM | POA: Diagnosis not present

## 2023-04-03 DIAGNOSIS — Z9181 History of falling: Secondary | ICD-10-CM | POA: Diagnosis not present

## 2023-04-03 DIAGNOSIS — Z79899 Other long term (current) drug therapy: Secondary | ICD-10-CM | POA: Diagnosis not present

## 2023-04-03 DIAGNOSIS — E43 Unspecified severe protein-calorie malnutrition: Secondary | ICD-10-CM | POA: Diagnosis not present

## 2023-04-03 DIAGNOSIS — Z87440 Personal history of urinary (tract) infections: Secondary | ICD-10-CM | POA: Diagnosis not present

## 2023-04-03 DIAGNOSIS — Z934 Other artificial openings of gastrointestinal tract status: Secondary | ICD-10-CM | POA: Diagnosis not present

## 2023-04-03 DIAGNOSIS — Z9884 Bariatric surgery status: Secondary | ICD-10-CM | POA: Diagnosis not present

## 2023-04-14 DIAGNOSIS — F4312 Post-traumatic stress disorder, chronic: Secondary | ICD-10-CM | POA: Diagnosis not present

## 2023-04-17 ENCOUNTER — Telehealth: Payer: Self-pay

## 2023-04-17 NOTE — Transitions of Care (Post Inpatient/ED Visit) (Signed)
   04/17/2023  Name: Victoria Andrews MRN: [id number] DOB: 02/09/1996  Today's TOC FU Call Status: Today's TOC FU Call Status:: Unsuccessul Call (1st Attempt) Unsuccessful Call (1st Attempt) Date: 04/17/23  Attempted to reach the patient regarding the most recent Inpatient/ED visit.  Follow Up Plan: Additional outreach attempts will be made to reach the patient to complete the Transitions of Care (Post Inpatient/ED visit) call.   Signature Agnes Lawrence, CMA (AAMA)  [facility] [phone]

## 2023-04-18 DIAGNOSIS — K9423 Gastrostomy malfunction: Secondary | ICD-10-CM | POA: Diagnosis not present

## 2023-04-18 DIAGNOSIS — K9413 Enterostomy malfunction: Secondary | ICD-10-CM | POA: Diagnosis not present

## 2023-04-24 NOTE — Transitions of Care (Post Inpatient/ED Visit) (Signed)
   04/24/2023  Name: Victoria Andrews MRN: [id number] DOB: 12-19-1996  Today's TOC FU Call Status: Today's TOC FU Call Status:: Unsuccessful Call (2nd Attempt) Unsuccessful Call (1st Attempt) Date: 04/17/23 Unsuccessful Call (2nd Attempt) Date: 04/24/23

## 2023-04-24 NOTE — Transitions of Care (Post Inpatient/ED Visit) (Signed)
   04/24/2023  Name: Victoria Andrews MRN: [id number] DOB: 21-Jan-1996  Today's TOC FU Call Status: Today's TOC FU Call Status:: Unsuccessful Call (2nd Attempt) Unsuccessful Call (1st Attempt) Date: 04/17/23 Unsuccessful Call (2nd Attempt) Date: 04/24/23  Attempted to reach the patient regarding the most recent Inpatient/ED visit.  Follow Up Plan: Additional outreach attempts will be made to reach the patient to complete the Transitions of Care (Post Inpatient/ED visit) call.   Signature Agnes Lawrence, CMA (AAMA)  [facility] [phone]

## 2023-04-29 DIAGNOSIS — Z934 Other artificial openings of gastrointestinal tract status: Secondary | ICD-10-CM | POA: Diagnosis not present

## 2023-04-29 DIAGNOSIS — E46 Unspecified protein-calorie malnutrition: Secondary | ICD-10-CM | POA: Diagnosis not present

## 2023-04-29 DIAGNOSIS — R112 Nausea with vomiting, unspecified: Secondary | ICD-10-CM | POA: Diagnosis not present

## 2023-04-30 DIAGNOSIS — Z934 Other artificial openings of gastrointestinal tract status: Secondary | ICD-10-CM | POA: Diagnosis not present

## 2023-04-30 DIAGNOSIS — E46 Unspecified protein-calorie malnutrition: Secondary | ICD-10-CM | POA: Diagnosis not present

## 2023-04-30 DIAGNOSIS — R112 Nausea with vomiting, unspecified: Secondary | ICD-10-CM | POA: Diagnosis not present

## 2023-04-30 NOTE — Transitions of Care (Post Inpatient/ED Visit) (Signed)
   04/30/2023  Name: Victoria Andrews MRN: [id number] DOB: 21-Sep-1996  Today's TOC FU Call Status: Today's TOC FU Call Status:: Unsuccessful Call (3rd Attempt) Unsuccessful Call (1st Attempt) Date: 04/17/23 Unsuccessful Call (2nd Attempt) Date: 04/24/23 Unsuccessful Call (3rd Attempt) Date: 04/30/23  Attempted to reach the patient regarding the most recent Inpatient/ED visit.  Follow Up Plan: No further outreach attempts will be made at this time. We have been unable to contact the patient.  Signature Agnes Lawrence, CMA (AAMA)  [facility] [phone]

## 2023-05-02 ENCOUNTER — Telehealth: Payer: Self-pay

## 2023-05-02 NOTE — Telephone Encounter (Signed)
Pts mom said that pt has been vomiting for 6 months and GI doctor in [place] put in J tube in pts stomach. About 1 wk ago the tubing had to be replaced due to leaking; per pts mom the pt said the tube in stomach is tight now. For the last 2 days pt has not been able to  use feeding tube because when she goes to take the ensure pt has pain in abd due to tubing being too tight. Misty Stanley has spoken with GI office and soonest appt they have is next Tues. Pt has appt 05/06/23 with GI in [place] but pts mom does not know if pt can wait that long to be seen. I advised pt if she is in pain and cannot take in a feeding then pt should go to ED. Pts mom said pt has just moved in with her and pt is presently asleep. Misty Stanley will talk with pt when she wakes up and Misty Stanley said she is also waiting on cb from GI to advise what to do.  Misty Stanley said if pt condition worsened before cb from GI doctor she will take pt to [facility] ED. Misty Stanley will cb next wk with update. Sending note to Hayden Pedro FNP who is out of office and Mayra Reel NP who is in office and [place] pool.

## 2023-05-02 NOTE — Telephone Encounter (Signed)
Noted and agree with nursing assessment and recommendations.

## 2023-05-05 NOTE — Telephone Encounter (Signed)
Noted and agree with ER recommendations.

## 2023-05-13 ENCOUNTER — Telehealth: Payer: BC Managed Care – PPO | Admitting: Family

## 2023-05-14 DIAGNOSIS — F4312 Post-traumatic stress disorder, chronic: Secondary | ICD-10-CM | POA: Diagnosis not present

## 2023-05-29 ENCOUNTER — Ambulatory Visit (INDEPENDENT_AMBULATORY_CARE_PROVIDER_SITE_OTHER): Payer: BC Managed Care – PPO | Admitting: Family

## 2023-05-29 ENCOUNTER — Encounter: Payer: Self-pay | Admitting: Family

## 2023-05-29 VITALS — BP 106/64 | HR 97 | Temp 97.9°F | Ht 65.0 in | Wt 137.2 lb

## 2023-05-29 DIAGNOSIS — R112 Nausea with vomiting, unspecified: Secondary | ICD-10-CM

## 2023-05-29 DIAGNOSIS — R4589 Other symptoms and signs involving emotional state: Secondary | ICD-10-CM | POA: Insufficient documentation

## 2023-05-29 DIAGNOSIS — R197 Diarrhea, unspecified: Secondary | ICD-10-CM

## 2023-05-29 DIAGNOSIS — K529 Noninfective gastroenteritis and colitis, unspecified: Secondary | ICD-10-CM

## 2023-05-29 DIAGNOSIS — R748 Abnormal levels of other serum enzymes: Secondary | ICD-10-CM

## 2023-05-29 DIAGNOSIS — R1115 Cyclical vomiting syndrome unrelated to migraine: Secondary | ICD-10-CM

## 2023-05-29 DIAGNOSIS — Z789 Other specified health status: Secondary | ICD-10-CM

## 2023-05-29 DIAGNOSIS — F3160 Bipolar disorder, current episode mixed, unspecified: Secondary | ICD-10-CM

## 2023-05-29 DIAGNOSIS — K909 Intestinal malabsorption, unspecified: Secondary | ICD-10-CM

## 2023-05-29 NOTE — Patient Instructions (Signed)
A referral was placed today for psychology.  Please let us know if you have not heard back within 2 weeks about the referral.   Regards,   Misa Fedorko FNP-C  

## 2023-05-29 NOTE — Assessment & Plan Note (Signed)
Continue f/u with psychiatry and medication as prescribed.  Did recommend therapy, pt ok with this. Referral placed.

## 2023-05-29 NOTE — Assessment & Plan Note (Signed)
Cont f/u with GI as scheduled 

## 2023-05-29 NOTE — Assessment & Plan Note (Signed)
Did again suggest pt quit smoking marijuana to reduce potential increase in symptoms due to this. Pt not ready to quit at this time.

## 2023-05-29 NOTE — Progress Notes (Signed)
Established Patient Office Visit  Subjective:      CC:  Chief Complaint  Patient presents with   Medical Management of Chronic Issues   Transitions Of Care    HPI: Victoria Andrews is a 27 y.o. female presenting on 05/29/2023 for Medical Management of Chronic Issues and Transitions Of Care . Here today to establish care as well as f/u appt from first appt where she was sent to the ER.   Chronic nausea and vomiting: has appt for consult with nausea clinic for initial eval with Dr. Jacinto Reap June 17th 2024 as this is ongoing without real resolution. She does have a J tube in place that was placed 02/07/2013. Currently on J tube feedings. She is taking regimen of compazine,  phenergan and zofran. She also takes ativan prn.   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 She does state surgery was 11/12/23 and nausea vomiting started and ongoing since 11/25/22.   Was in the hospital for cyclical vomiting , EKG with no acute ischemia. She then left [facility] and went to [facility] which was complicated by pancreatitis. Was discharged with mirtazpine 15 mg nightly which she has since d/c.  Ct abd pelvis no acute findings  Ct abd pelvis 12/03/22, 4.5 cm right ovarian cyst  Dg chest negative for active disease  Ct abd pelvis SBO at J tube. Bowel wall thickening  U/s renal: unremarkable  CT head w/o contrast unremarkable findings  Upper endoscopy 12/18/22 gastric sleeve with health appearing mucosa.   GA: currently on lexapro 10 mg and trazodone prn she does see a psychiatrist. She does not have a therapist. She had one in the past but she was unable to get an appointment scheduled. She has yet to re establish with one.   Pap smear: over three years ago, was normal in the past per her recollection.  Behind on dental exams (goes to LTR) and also eye exams overdue.   Marijuana smoker: states that she stopped for about three months, but then restarted because was without  improvement. She was on marinol in the past as well, she states it did make her eat and drink more.   Menses: has not had period in about two months. Only spots, and is usually dark brown. Denies any chance of pregnancy, has not been sexually active.    Social history:  Relevant past medical, surgical, family and social history reviewed and updated as indicated. Interim medical history since our last visit reviewed.  Allergies and medications reviewed and updated.  DATA REVIEWED: CHART IN EPIC     ROS: Negative unless specifically indicated above in HPI.    Current Outpatient Medications:    escitalopram (LEXAPRO) 10 MG tablet, Take 10 mg by mouth daily., Disp: , Rfl:    famotidine (PEPCID) 20 MG tablet, Take 20 mg by mouth daily as needed for heartburn or indigestion., Disp: , Rfl:    loperamide (IMODIUM) 2 MG capsule, Take 2 mg by mouth as needed for diarrhea or loose stools., Disp: , Rfl:    LORazepam (ATIVAN) 1 MG tablet, Take 1 mg by mouth 2 (two) times daily as needed., Disp: , Rfl:    ondansetron (ZOFRAN-ODT) 8 MG disintegrating tablet, Take 8 mg by mouth every 8 (eight) hours as needed., Disp: , Rfl:    Oxycodone HCl 10 MG TABS, Take 1 tablet by mouth 2 (two) times daily as needed., Disp: , Rfl:    prochlorperazine (COMPAZINE) 10  MG tablet, Take 1 tablet (10 mg total) by mouth every 6 (six) hours as needed for nausea or vomiting., Disp: 20 tablet, Rfl: 0   promethazine (PHENERGAN) 25 MG tablet, Take 1 tablet (25 mg total) by mouth every 4 (four) hours as needed for nausea or vomiting., Disp: 30 tablet, Rfl: 0   scopolamine (TRANSDERM-SCOP) 1 MG/3DAYS, Place 1 patch onto the skin every 3 (three) days., Disp: , Rfl:    traZODone (DESYREL) 50 MG tablet, Take 1 tablet by mouth at bedtime as needed., Disp: , Rfl:       Objective:    BP 106/64 (BP Location: Left Arm, Patient Position: Sitting, Cuff Size: Normal)   Pulse 97   Temp 97.9 F (36.6 C) (Temporal)   Ht 5\' 5"  (1.651  m)   Wt 137 lb 4 oz (62.3 kg)   LMP  (LMP Unknown) Comment: doesn't have one  SpO2 97%   BMI 22.84 kg/m   Wt Readings from Last 3 Encounters:  05/29/23 137 lb 4 oz (62.3 kg)  01/24/23 150 lb (68 kg)  01/10/23 150 lb (68 kg)    Physical Exam Constitutional:      General: She is not in acute distress.    Appearance: Normal appearance. She is normal weight. She is not ill-appearing, toxic-appearing or diaphoretic.  HENT:     Head: Normocephalic.     Right Ear: Hearing, tympanic membrane, ear canal and external ear normal.     Left Ear: Hearing, tympanic membrane, ear canal and external ear normal.     Mouth/Throat:     Tongue: No lesions.     Pharynx: No posterior oropharyngeal erythema.  Cardiovascular:     Rate and Rhythm: Normal rate and regular rhythm.  Pulmonary:     Effort: Pulmonary effort is normal.     Breath sounds: Normal breath sounds.  Musculoskeletal:        General: Normal range of motion.  Skin:    General: Skin is warm.  Neurological:     General: No focal deficit present.     Mental Status: She is alert and oriented to person, place, and time. Mental status is at baseline.  Psychiatric:        Mood and Affect: Mood normal.        Behavior: Behavior normal.        Thought Content: Thought content normal.        Judgment: Judgment normal.            Assessment & Plan:  Bipolar 1 disorder, mixed (HCC) Assessment & Plan: Continue f/u with psychiatry and medication as prescribed.  Did recommend therapy, pt ok with this. Referral placed.     Orders: -     Ambulatory referral to Psychology  Anxiety about health -     Ambulatory referral to Psychology  Chronic diarrhea  Intractable nausea and vomiting Assessment & Plan: Continue with anti nausea regimen as prescribed and managed by GI    Diarrhea due to malabsorption Assessment & Plan: Stable Uses immodium prn    Cyclic vomiting syndrome Assessment & Plan: Did again suggest pt quit  smoking marijuana to reduce potential increase in symptoms due to this. Pt not ready to quit at this time.    On total parenteral nutrition (TPN) Assessment & Plan: Cont f/u with GI as scheduled.     Elevated lipase Assessment & Plan: Trending down in jan continue management with GI Currently asymptomatic for pancreatitis symptoms  Return in about 1 year (around 05/28/2024) for f/u CPE.  Mort Sawyers, MSN, APRN, FNP-C [facility]

## 2023-05-29 NOTE — Assessment & Plan Note (Signed)
Stable Uses immodium prn

## 2023-05-29 NOTE — Assessment & Plan Note (Signed)
Continue with anti nausea regimen as prescribed and managed by GI

## 2023-05-29 NOTE — Assessment & Plan Note (Signed)
Trending down in jan continue management with GI Currently asymptomatic for pancreatitis symptoms

## 2023-06-16 DIAGNOSIS — F339 Major depressive disorder, recurrent, unspecified: Secondary | ICD-10-CM | POA: Diagnosis not present

## 2023-06-16 DIAGNOSIS — R634 Abnormal weight loss: Secondary | ICD-10-CM | POA: Diagnosis not present

## 2023-06-16 DIAGNOSIS — R1114 Bilious vomiting: Secondary | ICD-10-CM | POA: Diagnosis not present

## 2023-06-16 DIAGNOSIS — K219 Gastro-esophageal reflux disease without esophagitis: Secondary | ICD-10-CM | POA: Diagnosis not present

## 2023-06-30 DIAGNOSIS — R112 Nausea with vomiting, unspecified: Secondary | ICD-10-CM | POA: Diagnosis not present

## 2023-06-30 DIAGNOSIS — Z9884 Bariatric surgery status: Secondary | ICD-10-CM | POA: Diagnosis not present

## 2023-06-30 DIAGNOSIS — Z934 Other artificial openings of gastrointestinal tract status: Secondary | ICD-10-CM | POA: Diagnosis not present

## 2023-06-30 DIAGNOSIS — Z48815 Encounter for surgical aftercare following surgery on the digestive system: Secondary | ICD-10-CM | POA: Diagnosis not present

## 2023-07-10 DIAGNOSIS — R1110 Vomiting, unspecified: Secondary | ICD-10-CM | POA: Diagnosis not present

## 2023-08-04 ENCOUNTER — Emergency Department
Admission: EM | Admit: 2023-08-04 | Discharge: 2023-08-04 | Disposition: A | Discharge status: Home / Self Care | Payer: BC Managed Care – PPO | Attending: Student in an Organized Health Care Education/Training Program | Admitting: Student in an Organized Health Care Education/Training Program

## 2023-08-04 ENCOUNTER — Encounter: Payer: Self-pay | Admitting: Emergency Medicine

## 2023-08-04 ENCOUNTER — Other Ambulatory Visit: Payer: Self-pay

## 2023-08-04 DIAGNOSIS — Z434 Encounter for attention to other artificial openings of digestive tract: Secondary | ICD-10-CM | POA: Insufficient documentation

## 2023-08-04 DIAGNOSIS — Z934 Other artificial openings of gastrointestinal tract status: Secondary | ICD-10-CM

## 2023-08-04 DIAGNOSIS — T85528A Displacement of other gastrointestinal prosthetic devices, implants and grafts, initial encounter: Secondary | ICD-10-CM | POA: Diagnosis not present

## 2023-08-04 DIAGNOSIS — Z48815 Encounter for surgical aftercare following surgery on the digestive system: Secondary | ICD-10-CM | POA: Diagnosis not present

## 2023-08-04 DIAGNOSIS — K9413 Enterostomy malfunction: Secondary | ICD-10-CM | POA: Diagnosis not present

## 2023-08-04 NOTE — ED Provider Notes (Signed)
[facility] Provider Note    Event Date/Time   First MD Initiated Contact with Patient 08/04/23 1150     (approximate)   History   J Tube Problem   HPI  Victoria Andrews is a 27 y.o. female who presents to the ER for evaluation of J-tube issues.  Has had the J-tube in place for many months.  She is concerned because the plug is starting to come undone and feels some discomfort at the site and has noted some bleeding.  Does not wear dressing to site because she has an allergy to adhesives.  No fevers.  No constipation.  She is able to get medications and feedings through the J-tube without complication.     Physical Exam   Triage Vital Signs: ED Triage Vitals  Encounter Vitals Group     BP 08/04/23 1133 93/66     Systolic BP Percentile --      Diastolic BP Percentile --      Pulse Rate 08/04/23 1133 65     Resp 08/04/23 1133 18     Temp 08/04/23 1133 98.1 F (36.7 C)     Temp Source 08/04/23 1133 Oral     SpO2 08/04/23 1133 99 %     Weight 08/04/23 1130 136 lb (61.7 kg)     Height 08/04/23 1130 5\' 5"  (1.651 m)     Head Circumference --      Peak Flow --      Pain Score 08/04/23 1130 7     Pain Loc --      Pain Education --      Exclude from Growth Chart --     Most recent vital signs: Vitals:   08/04/23 1133  BP: 93/66  Pulse: 65  Resp: 18  Temp: 98.1 F (36.7 C)  SpO2: 99%     Constitutional: Alert  Eyes: Conjunctivae are normal.  Head: Atraumatic. Nose: No congestion/rhinnorhea. Mouth/Throat: Mucous membranes are moist.   Neck: Painless ROM.  Cardiovascular:   Good peripheral circulation. Respiratory: Normal respiratory effort.  No retractions.  Gastrointestinal: Soft and nontender.  No sign of infection or bleeding.  J-tube stoma clean dry and intact. Musculoskeletal:  no deformity Neurologic:  MAE spontaneously. No gross focal neurologic deficits are appreciated.  Skin:  Skin is warm, dry and intact. No rash  noted. Psychiatric: Mood and affect are normal. Speech and behavior are normal.    ED Results / Procedures / Treatments   Labs (all labs ordered are listed, but only abnormal results are displayed) Labs Reviewed - No data to display   EKG     RADIOLOGY    PROCEDURES:  Critical Care performed:   Procedures   MEDICATIONS ORDERED IN ED: Medications - No data to display   IMPRESSION / MDM / ASSESSMENT AND PLAN / ED COURSE  I reviewed the triage vital signs and the nursing notes.                              Differential diagnosis includes, but is not limited to, displaced J-tube, malfunctioning J-tube, infection  Patient presented to the ER for evaluation of symptoms as described above.  Her exam is reassuring she is in no acute distress.  No sign of bleeding.  Small amount of granulation tissue just medial to the J-tube insertion without bleeding or sign of cellulitis or infection.  Given functioning J-tube with reassuring exam.  We  provided an additional stopper for the J-tube.  She is established with [facility] in [place] and plans to follow-up with them to discuss exchange.       FINAL CLINICAL IMPRESSION(S) / ED DIAGNOSES   Final diagnoses:  Jejunostomy tube present (HCC)     Rx / DC Orders   ED Discharge Orders     None        Note:  This document was prepared using Dragon voice recognition software and may include unintentional dictation errors.    Willy Eddy, MD 08/04/23 1247

## 2023-08-04 NOTE — ED Triage Notes (Signed)
Patient to ED via POV for J tube problems. Patient states it has been uncomfortable and bleeding around the site. Ongoing x1 week.

## 2023-08-05 ENCOUNTER — Telehealth: Payer: Self-pay

## 2023-08-05 NOTE — Transitions of Care (Post Inpatient/ED Visit) (Signed)
   08/05/2023  Name: Victoria Andrews MRN: [id number] DOB: 06-26-1996  Today's TOC FU Call Status: Today's TOC FU Call Status:: Successful TOC FU Call Completed TOC FU Call Complete Date: 08/05/23  Transition Care Management Follow-up Telephone Call Date of Discharge: 08/03/23 Discharge Facility: [facility]) Type of Discharge: Emergency Department How have you been since you were released from the hospital?: Better Any questions or concerns?: No  Items Reviewed: Did you receive and understand the discharge instructions provided?: Yes Medications obtained,verified, and reconciled?: Yes (Medications Reviewed) Any new allergies since your discharge?: No Dietary orders reviewed?: NA Do you have support at home?: Yes People in Home: parent(s)  Medications Reviewed Today: Medications Reviewed Today   Medications were not reviewed in this encounter     Home Care and Equipment/Supplies: Were Home Health Services Ordered?: No Any new equipment or medical supplies ordered?: No  Functional Questionnaire: Do you need assistance with bathing/showering or dressing?: No Do you need assistance with meal preparation?: No Do you need assistance with eating?: No Do you have difficulty maintaining continence: No Do you need assistance with getting out of bed/getting out of a chair/moving?: No Do you have difficulty managing or taking your medications?: No  Follow up appointments reviewed: PCP Follow-up appointment confirmed?: No Specialist Hospital Follow-up appointment confirmed?: No Do you need transportation to your follow-up appointment?: No Do you understand care options if your condition(s) worsen?: Yes-patient verbalized understanding    SIGNATURE tb,cma

## 2023-08-07 DIAGNOSIS — Z6823 Body mass index (BMI) 23.0-23.9, adult: Secondary | ICD-10-CM | POA: Diagnosis not present

## 2023-08-07 DIAGNOSIS — Z881 Allergy status to other antibiotic agents status: Secondary | ICD-10-CM | POA: Diagnosis not present

## 2023-08-07 DIAGNOSIS — R112 Nausea with vomiting, unspecified: Secondary | ICD-10-CM | POA: Diagnosis not present

## 2023-08-07 DIAGNOSIS — Z431 Encounter for attention to gastrostomy: Secondary | ICD-10-CM | POA: Diagnosis not present

## 2023-08-07 DIAGNOSIS — Z79891 Long term (current) use of opiate analgesic: Secondary | ICD-10-CM | POA: Diagnosis not present

## 2023-08-07 DIAGNOSIS — G8929 Other chronic pain: Secondary | ICD-10-CM | POA: Diagnosis not present

## 2023-08-07 DIAGNOSIS — E43 Unspecified severe protein-calorie malnutrition: Secondary | ICD-10-CM | POA: Diagnosis not present

## 2023-08-07 DIAGNOSIS — M549 Dorsalgia, unspecified: Secondary | ICD-10-CM | POA: Diagnosis not present

## 2023-08-07 DIAGNOSIS — Z79899 Other long term (current) drug therapy: Secondary | ICD-10-CM | POA: Diagnosis not present

## 2023-08-07 DIAGNOSIS — E860 Dehydration: Secondary | ICD-10-CM | POA: Diagnosis not present

## 2023-08-07 DIAGNOSIS — E46 Unspecified protein-calorie malnutrition: Secondary | ICD-10-CM | POA: Diagnosis not present

## 2023-08-07 DIAGNOSIS — K9413 Enterostomy malfunction: Secondary | ICD-10-CM | POA: Diagnosis not present

## 2023-08-07 DIAGNOSIS — F319 Bipolar disorder, unspecified: Secondary | ICD-10-CM | POA: Diagnosis not present

## 2023-08-07 DIAGNOSIS — Z888 Allergy status to other drugs, medicaments and biological substances status: Secondary | ICD-10-CM | POA: Diagnosis not present

## 2023-08-07 DIAGNOSIS — K9419 Other complications of enterostomy: Secondary | ICD-10-CM | POA: Diagnosis not present

## 2023-08-07 DIAGNOSIS — R627 Adult failure to thrive: Secondary | ICD-10-CM | POA: Diagnosis not present

## 2023-08-08 DIAGNOSIS — Z431 Encounter for attention to gastrostomy: Secondary | ICD-10-CM | POA: Diagnosis not present

## 2023-08-08 DIAGNOSIS — K9413 Enterostomy malfunction: Secondary | ICD-10-CM | POA: Diagnosis not present

## 2023-08-11 ENCOUNTER — Telehealth: Payer: Self-pay

## 2023-08-11 NOTE — Transitions of Care (Post Inpatient/ED Visit) (Unsigned)
   08/11/2023  Name: Victoria Andrews MRN: [id number] DOB: 1996/06/12  Today's TOC FU Call Status: Today's TOC FU Call Status:: Unsuccessful Call (1st Attempt) Unsuccessful Call (1st Attempt) Date: 08/11/23  Attempted to reach the patient regarding the most recent Inpatient/ED visit.  Follow Up Plan: Additional outreach attempts will be made to reach the patient to complete the Transitions of Care (Post Inpatient/ED visit) call.   Signature   Woodfin Ganja LPN [facility] Nurse Health Advisor Direct Dial [phone]

## 2023-08-12 NOTE — Transitions of Care (Post Inpatient/ED Visit) (Unsigned)
   08/12/2023  Name: Victoria Andrews MRN: [id number] DOB: 1996-01-04  Today's TOC FU Call Status: Today's TOC FU Call Status:: Unsuccessful Call (2nd Attempt) Unsuccessful Call (1st Attempt) Date: 08/11/23 Unsuccessful Call (2nd Attempt) Date: 08/12/23  Attempted to reach the patient regarding the most recent Inpatient/ED visit.  Follow Up Plan: Additional outreach attempts will be made to reach the patient to complete the Transitions of Care (Post Inpatient/ED visit) call.   Signature  Woodfin Ganja LPN [facility] Nurse Health Advisor Direct Dial [phone]

## 2023-08-14 DIAGNOSIS — F4312 Post-traumatic stress disorder, chronic: Secondary | ICD-10-CM | POA: Diagnosis not present

## 2023-08-14 NOTE — Transitions of Care (Post Inpatient/ED Visit) (Signed)
   08/14/2023  Name: Richlynn Gan MRN: [id number] DOB: Nov 30, 1996  Today's TOC FU Call Status: Today's TOC FU Call Status:: Successful TOC FU Call Completed Unsuccessful Call (1st Attempt) Date: 08/11/23 Unsuccessful Call (2nd Attempt) Date: 08/12/23 Unsuccessful Call (3rd Attempt) Date: 08/14/23 [facility] FU Call Complete Date: 08/14/23  Attempted to reach the patient regarding the most recent Inpatient/ED visit.  Follow Up Plan: No further outreach attempts will be made at this time. We have been unable to contact the patient.  Signature   Woodfin Ganja LPN [facility] Nurse Health Advisor Direct Dial [phone]

## 2023-08-28 DIAGNOSIS — Z934 Other artificial openings of gastrointestinal tract status: Secondary | ICD-10-CM | POA: Diagnosis not present

## 2023-08-28 DIAGNOSIS — E46 Unspecified protein-calorie malnutrition: Secondary | ICD-10-CM | POA: Diagnosis not present

## 2023-08-28 DIAGNOSIS — R112 Nausea with vomiting, unspecified: Secondary | ICD-10-CM | POA: Diagnosis not present

## 2023-08-29 DIAGNOSIS — Z713 Dietary counseling and surveillance: Secondary | ICD-10-CM | POA: Diagnosis not present

## 2023-08-29 DIAGNOSIS — K3184 Gastroparesis: Secondary | ICD-10-CM | POA: Diagnosis not present

## 2023-08-29 DIAGNOSIS — Z9884 Bariatric surgery status: Secondary | ICD-10-CM | POA: Diagnosis not present

## 2023-08-29 DIAGNOSIS — K912 Postsurgical malabsorption, not elsewhere classified: Secondary | ICD-10-CM | POA: Diagnosis not present

## 2023-08-29 DIAGNOSIS — Z48815 Encounter for surgical aftercare following surgery on the digestive system: Secondary | ICD-10-CM | POA: Diagnosis not present

## 2023-08-29 DIAGNOSIS — R112 Nausea with vomiting, unspecified: Secondary | ICD-10-CM | POA: Diagnosis not present

## 2023-08-29 DIAGNOSIS — Z8719 Personal history of other diseases of the digestive system: Secondary | ICD-10-CM | POA: Diagnosis not present

## 2023-08-29 DIAGNOSIS — Z9049 Acquired absence of other specified parts of digestive tract: Secondary | ICD-10-CM | POA: Diagnosis not present

## 2023-08-31 DIAGNOSIS — E46 Unspecified protein-calorie malnutrition: Secondary | ICD-10-CM | POA: Diagnosis not present

## 2023-08-31 DIAGNOSIS — Z934 Other artificial openings of gastrointestinal tract status: Secondary | ICD-10-CM | POA: Diagnosis not present

## 2023-08-31 DIAGNOSIS — R112 Nausea with vomiting, unspecified: Secondary | ICD-10-CM | POA: Diagnosis not present

## 2023-10-01 DIAGNOSIS — K3184 Gastroparesis: Secondary | ICD-10-CM | POA: Diagnosis not present

## 2023-10-01 DIAGNOSIS — R112 Nausea with vomiting, unspecified: Secondary | ICD-10-CM | POA: Diagnosis not present

## 2023-10-01 DIAGNOSIS — K219 Gastro-esophageal reflux disease without esophagitis: Secondary | ICD-10-CM | POA: Diagnosis not present

## 2023-10-01 DIAGNOSIS — K224 Dyskinesia of esophagus: Secondary | ICD-10-CM | POA: Diagnosis not present

## 2023-10-01 DIAGNOSIS — R194 Change in bowel habit: Secondary | ICD-10-CM | POA: Diagnosis not present

## 2023-10-06 DIAGNOSIS — K3189 Other diseases of stomach and duodenum: Secondary | ICD-10-CM | POA: Diagnosis not present

## 2023-10-06 DIAGNOSIS — R1310 Dysphagia, unspecified: Secondary | ICD-10-CM | POA: Diagnosis not present

## 2023-10-06 DIAGNOSIS — K2100 Gastro-esophageal reflux disease with esophagitis, without bleeding: Secondary | ICD-10-CM | POA: Diagnosis not present

## 2023-10-06 DIAGNOSIS — K2950 Unspecified chronic gastritis without bleeding: Secondary | ICD-10-CM | POA: Diagnosis not present

## 2023-10-06 DIAGNOSIS — Z9884 Bariatric surgery status: Secondary | ICD-10-CM | POA: Diagnosis not present

## 2023-10-06 DIAGNOSIS — R110 Nausea: Secondary | ICD-10-CM | POA: Diagnosis not present

## 2023-10-06 DIAGNOSIS — K219 Gastro-esophageal reflux disease without esophagitis: Secondary | ICD-10-CM | POA: Diagnosis not present

## 2023-10-06 DIAGNOSIS — T182XXA Foreign body in stomach, initial encounter: Secondary | ICD-10-CM | POA: Diagnosis not present

## 2023-10-06 DIAGNOSIS — R112 Nausea with vomiting, unspecified: Secondary | ICD-10-CM | POA: Diagnosis not present

## 2023-10-06 DIAGNOSIS — D7219 Other eosinophilia: Secondary | ICD-10-CM | POA: Diagnosis not present

## 2023-10-06 DIAGNOSIS — K208 Other esophagitis without bleeding: Secondary | ICD-10-CM | POA: Diagnosis not present

## 2023-10-24 DIAGNOSIS — Z9884 Bariatric surgery status: Secondary | ICD-10-CM | POA: Diagnosis not present

## 2023-10-24 DIAGNOSIS — R112 Nausea with vomiting, unspecified: Secondary | ICD-10-CM | POA: Diagnosis not present

## 2023-10-24 DIAGNOSIS — K219 Gastro-esophageal reflux disease without esophagitis: Secondary | ICD-10-CM | POA: Diagnosis not present

## 2023-10-24 DIAGNOSIS — K912 Postsurgical malabsorption, not elsewhere classified: Secondary | ICD-10-CM | POA: Diagnosis not present

## 2023-10-27 DIAGNOSIS — E46 Unspecified protein-calorie malnutrition: Secondary | ICD-10-CM | POA: Diagnosis not present

## 2023-10-27 DIAGNOSIS — R112 Nausea with vomiting, unspecified: Secondary | ICD-10-CM | POA: Diagnosis not present

## 2023-10-27 DIAGNOSIS — Z934 Other artificial openings of gastrointestinal tract status: Secondary | ICD-10-CM | POA: Diagnosis not present

## 2023-10-28 DIAGNOSIS — K3184 Gastroparesis: Secondary | ICD-10-CM | POA: Diagnosis not present

## 2023-10-28 DIAGNOSIS — K912 Postsurgical malabsorption, not elsewhere classified: Secondary | ICD-10-CM | POA: Diagnosis not present

## 2023-10-28 DIAGNOSIS — Z9884 Bariatric surgery status: Secondary | ICD-10-CM | POA: Diagnosis not present

## 2023-10-28 DIAGNOSIS — R112 Nausea with vomiting, unspecified: Secondary | ICD-10-CM | POA: Diagnosis not present

## 2023-10-31 DIAGNOSIS — E46 Unspecified protein-calorie malnutrition: Secondary | ICD-10-CM | POA: Diagnosis not present

## 2023-10-31 DIAGNOSIS — R112 Nausea with vomiting, unspecified: Secondary | ICD-10-CM | POA: Diagnosis not present

## 2023-10-31 DIAGNOSIS — Z934 Other artificial openings of gastrointestinal tract status: Secondary | ICD-10-CM | POA: Diagnosis not present

## 2023-11-12 DIAGNOSIS — F4312 Post-traumatic stress disorder, chronic: Secondary | ICD-10-CM | POA: Diagnosis not present

## 2023-11-26 DIAGNOSIS — E46 Unspecified protein-calorie malnutrition: Secondary | ICD-10-CM | POA: Diagnosis not present

## 2023-11-26 DIAGNOSIS — Z934 Other artificial openings of gastrointestinal tract status: Secondary | ICD-10-CM | POA: Diagnosis not present

## 2023-11-26 DIAGNOSIS — R112 Nausea with vomiting, unspecified: Secondary | ICD-10-CM | POA: Diagnosis not present

## 2023-11-30 DIAGNOSIS — R112 Nausea with vomiting, unspecified: Secondary | ICD-10-CM | POA: Diagnosis not present

## 2023-11-30 DIAGNOSIS — Z934 Other artificial openings of gastrointestinal tract status: Secondary | ICD-10-CM | POA: Diagnosis not present

## 2023-11-30 DIAGNOSIS — E46 Unspecified protein-calorie malnutrition: Secondary | ICD-10-CM | POA: Diagnosis not present

## 2023-12-19 DIAGNOSIS — Z434 Encounter for attention to other artificial openings of digestive tract: Secondary | ICD-10-CM | POA: Diagnosis not present

## 2024-01-28 DIAGNOSIS — Z4682 Encounter for fitting and adjustment of non-vascular catheter: Secondary | ICD-10-CM | POA: Diagnosis not present

## 2024-01-28 DIAGNOSIS — K9413 Enterostomy malfunction: Secondary | ICD-10-CM | POA: Diagnosis not present

## 2024-01-28 DIAGNOSIS — K9423 Gastrostomy malfunction: Secondary | ICD-10-CM | POA: Diagnosis not present

## 2024-01-28 DIAGNOSIS — Z434 Encounter for attention to other artificial openings of digestive tract: Secondary | ICD-10-CM | POA: Diagnosis not present

## 2024-02-03 DIAGNOSIS — R112 Nausea with vomiting, unspecified: Secondary | ICD-10-CM | POA: Diagnosis not present

## 2024-02-03 DIAGNOSIS — Z48815 Encounter for surgical aftercare following surgery on the digestive system: Secondary | ICD-10-CM | POA: Diagnosis not present

## 2024-02-03 DIAGNOSIS — E46 Unspecified protein-calorie malnutrition: Secondary | ICD-10-CM | POA: Diagnosis not present

## 2024-02-03 DIAGNOSIS — Z934 Other artificial openings of gastrointestinal tract status: Secondary | ICD-10-CM | POA: Diagnosis not present

## 2024-02-10 DIAGNOSIS — F4312 Post-traumatic stress disorder, chronic: Secondary | ICD-10-CM | POA: Diagnosis not present

## 2024-02-13 DIAGNOSIS — R634 Abnormal weight loss: Secondary | ICD-10-CM | POA: Diagnosis not present

## 2024-02-13 DIAGNOSIS — Z01818 Encounter for other preprocedural examination: Secondary | ICD-10-CM | POA: Diagnosis not present

## 2024-02-13 DIAGNOSIS — Z7182 Exercise counseling: Secondary | ICD-10-CM | POA: Diagnosis not present

## 2024-02-13 DIAGNOSIS — K224 Dyskinesia of esophagus: Secondary | ICD-10-CM | POA: Diagnosis not present

## 2024-02-13 DIAGNOSIS — Z7722 Contact with and (suspected) exposure to environmental tobacco smoke (acute) (chronic): Secondary | ICD-10-CM | POA: Diagnosis not present

## 2024-02-13 DIAGNOSIS — R1013 Epigastric pain: Secondary | ICD-10-CM | POA: Diagnosis not present

## 2024-02-13 DIAGNOSIS — Z9049 Acquired absence of other specified parts of digestive tract: Secondary | ICD-10-CM | POA: Diagnosis not present

## 2024-02-13 DIAGNOSIS — K449 Diaphragmatic hernia without obstruction or gangrene: Secondary | ICD-10-CM | POA: Diagnosis not present

## 2024-02-13 DIAGNOSIS — Z713 Dietary counseling and surveillance: Secondary | ICD-10-CM | POA: Diagnosis not present

## 2024-02-13 DIAGNOSIS — Z48815 Encounter for surgical aftercare following surgery on the digestive system: Secondary | ICD-10-CM | POA: Diagnosis not present

## 2024-02-13 DIAGNOSIS — Z9884 Bariatric surgery status: Secondary | ICD-10-CM | POA: Diagnosis not present

## 2024-02-13 DIAGNOSIS — Z6821 Body mass index (BMI) 21.0-21.9, adult: Secondary | ICD-10-CM | POA: Diagnosis not present

## 2024-02-13 DIAGNOSIS — R112 Nausea with vomiting, unspecified: Secondary | ICD-10-CM | POA: Diagnosis not present

## 2024-02-13 DIAGNOSIS — K2100 Gastro-esophageal reflux disease with esophagitis, without bleeding: Secondary | ICD-10-CM | POA: Diagnosis not present

## 2024-02-24 DIAGNOSIS — J45909 Unspecified asthma, uncomplicated: Secondary | ICD-10-CM | POA: Diagnosis not present

## 2024-02-24 DIAGNOSIS — R1084 Generalized abdominal pain: Secondary | ICD-10-CM | POA: Diagnosis not present

## 2024-02-24 DIAGNOSIS — Z9049 Acquired absence of other specified parts of digestive tract: Secondary | ICD-10-CM | POA: Diagnosis not present

## 2024-02-24 DIAGNOSIS — E876 Hypokalemia: Secondary | ICD-10-CM | POA: Diagnosis not present

## 2024-02-24 DIAGNOSIS — R1030 Lower abdominal pain, unspecified: Secondary | ICD-10-CM | POA: Diagnosis not present

## 2024-02-24 DIAGNOSIS — Z934 Other artificial openings of gastrointestinal tract status: Secondary | ICD-10-CM | POA: Diagnosis not present

## 2024-02-24 DIAGNOSIS — Z79899 Other long term (current) drug therapy: Secondary | ICD-10-CM | POA: Diagnosis not present

## 2024-02-24 DIAGNOSIS — R112 Nausea with vomiting, unspecified: Secondary | ICD-10-CM | POA: Diagnosis not present

## 2024-02-24 DIAGNOSIS — Z9884 Bariatric surgery status: Secondary | ICD-10-CM | POA: Diagnosis not present

## 2024-02-24 DIAGNOSIS — Z931 Gastrostomy status: Secondary | ICD-10-CM | POA: Diagnosis not present

## 2024-02-24 DIAGNOSIS — I498 Other specified cardiac arrhythmias: Secondary | ICD-10-CM | POA: Diagnosis not present

## 2024-02-24 DIAGNOSIS — R1013 Epigastric pain: Secondary | ICD-10-CM | POA: Diagnosis not present

## 2024-02-24 DIAGNOSIS — G8929 Other chronic pain: Secondary | ICD-10-CM | POA: Diagnosis not present

## 2024-02-24 DIAGNOSIS — K219 Gastro-esophageal reflux disease without esophagitis: Secondary | ICD-10-CM | POA: Diagnosis not present

## 2024-02-25 DIAGNOSIS — R1033 Periumbilical pain: Secondary | ICD-10-CM | POA: Diagnosis not present

## 2024-02-26 DIAGNOSIS — Z934 Other artificial openings of gastrointestinal tract status: Secondary | ICD-10-CM | POA: Diagnosis not present

## 2024-02-26 DIAGNOSIS — R1033 Periumbilical pain: Secondary | ICD-10-CM | POA: Diagnosis not present

## 2024-02-28 DIAGNOSIS — Z934 Other artificial openings of gastrointestinal tract status: Secondary | ICD-10-CM | POA: Diagnosis not present

## 2024-02-28 DIAGNOSIS — E46 Unspecified protein-calorie malnutrition: Secondary | ICD-10-CM | POA: Diagnosis not present

## 2024-02-28 DIAGNOSIS — R112 Nausea with vomiting, unspecified: Secondary | ICD-10-CM | POA: Diagnosis not present

## 2024-03-19 DIAGNOSIS — E43 Unspecified severe protein-calorie malnutrition: Secondary | ICD-10-CM | POA: Diagnosis not present

## 2024-03-24 DIAGNOSIS — E46 Unspecified protein-calorie malnutrition: Secondary | ICD-10-CM | POA: Diagnosis not present

## 2024-03-24 DIAGNOSIS — Z934 Other artificial openings of gastrointestinal tract status: Secondary | ICD-10-CM | POA: Diagnosis not present

## 2024-03-24 DIAGNOSIS — R112 Nausea with vomiting, unspecified: Secondary | ICD-10-CM | POA: Diagnosis not present

## 2024-03-25 DIAGNOSIS — F1210 Cannabis abuse, uncomplicated: Secondary | ICD-10-CM | POA: Diagnosis not present

## 2024-03-30 DIAGNOSIS — R112 Nausea with vomiting, unspecified: Secondary | ICD-10-CM | POA: Diagnosis not present

## 2024-03-30 DIAGNOSIS — Z934 Other artificial openings of gastrointestinal tract status: Secondary | ICD-10-CM | POA: Diagnosis not present

## 2024-03-30 DIAGNOSIS — E46 Unspecified protein-calorie malnutrition: Secondary | ICD-10-CM | POA: Diagnosis not present

## 2024-04-05 DIAGNOSIS — K9429 Other complications of gastrostomy: Secondary | ICD-10-CM | POA: Diagnosis not present

## 2024-04-05 DIAGNOSIS — K9413 Enterostomy malfunction: Secondary | ICD-10-CM | POA: Diagnosis not present

## 2024-04-20 ENCOUNTER — Other Ambulatory Visit (HOSPITAL_COMMUNITY)
Admission: RE | Admit: 2024-04-20 | Discharge: 2024-04-20 | Disposition: A | Discharge status: Home / Self Care | Source: Ambulatory Visit | Attending: *Deleted | Admitting: *Deleted

## 2024-04-23 DIAGNOSIS — K912 Postsurgical malabsorption, not elsewhere classified: Secondary | ICD-10-CM | POA: Diagnosis not present

## 2024-04-23 DIAGNOSIS — Z713 Dietary counseling and surveillance: Secondary | ICD-10-CM | POA: Diagnosis not present

## 2024-04-23 DIAGNOSIS — K219 Gastro-esophageal reflux disease without esophagitis: Secondary | ICD-10-CM | POA: Diagnosis not present

## 2024-04-23 DIAGNOSIS — R112 Nausea with vomiting, unspecified: Secondary | ICD-10-CM | POA: Diagnosis not present

## 2024-04-29 DIAGNOSIS — R112 Nausea with vomiting, unspecified: Secondary | ICD-10-CM | POA: Diagnosis not present

## 2024-04-29 DIAGNOSIS — Z48815 Encounter for surgical aftercare following surgery on the digestive system: Secondary | ICD-10-CM | POA: Diagnosis not present

## 2024-05-10 DIAGNOSIS — R112 Nausea with vomiting, unspecified: Secondary | ICD-10-CM | POA: Diagnosis not present

## 2024-05-10 DIAGNOSIS — F4312 Post-traumatic stress disorder, chronic: Secondary | ICD-10-CM | POA: Diagnosis not present

## 2024-05-11 DIAGNOSIS — K219 Gastro-esophageal reflux disease without esophagitis: Secondary | ICD-10-CM | POA: Diagnosis not present

## 2024-05-11 DIAGNOSIS — R112 Nausea with vomiting, unspecified: Secondary | ICD-10-CM | POA: Diagnosis not present

## 2024-05-11 DIAGNOSIS — Z9884 Bariatric surgery status: Secondary | ICD-10-CM | POA: Diagnosis not present

## 2024-05-13 DIAGNOSIS — Z713 Dietary counseling and surveillance: Secondary | ICD-10-CM | POA: Diagnosis not present

## 2024-05-13 DIAGNOSIS — Z9884 Bariatric surgery status: Secondary | ICD-10-CM | POA: Diagnosis not present

## 2024-05-17 DIAGNOSIS — Z9884 Bariatric surgery status: Secondary | ICD-10-CM | POA: Diagnosis not present

## 2024-05-17 DIAGNOSIS — Z01818 Encounter for other preprocedural examination: Secondary | ICD-10-CM | POA: Diagnosis not present

## 2024-05-19 DIAGNOSIS — K9429 Other complications of gastrostomy: Secondary | ICD-10-CM | POA: Diagnosis not present

## 2024-05-19 DIAGNOSIS — K219 Gastro-esophageal reflux disease without esophagitis: Secondary | ICD-10-CM | POA: Diagnosis not present

## 2024-05-19 DIAGNOSIS — F419 Anxiety disorder, unspecified: Secondary | ICD-10-CM | POA: Diagnosis not present

## 2024-05-19 DIAGNOSIS — Z7901 Long term (current) use of anticoagulants: Secondary | ICD-10-CM | POA: Diagnosis not present

## 2024-05-19 DIAGNOSIS — R109 Unspecified abdominal pain: Secondary | ICD-10-CM | POA: Diagnosis not present

## 2024-05-19 DIAGNOSIS — Z01818 Encounter for other preprocedural examination: Secondary | ICD-10-CM | POA: Diagnosis not present

## 2024-05-19 DIAGNOSIS — F418 Other specified anxiety disorders: Secondary | ICD-10-CM | POA: Diagnosis not present

## 2024-05-19 DIAGNOSIS — Z87440 Personal history of urinary (tract) infections: Secondary | ICD-10-CM | POA: Diagnosis not present

## 2024-05-19 DIAGNOSIS — E669 Obesity, unspecified: Secondary | ICD-10-CM | POA: Diagnosis not present

## 2024-05-19 DIAGNOSIS — G894 Chronic pain syndrome: Secondary | ICD-10-CM | POA: Diagnosis not present

## 2024-05-19 DIAGNOSIS — Z5986 Financial insecurity: Secondary | ICD-10-CM | POA: Diagnosis not present

## 2024-05-19 DIAGNOSIS — G4700 Insomnia, unspecified: Secondary | ICD-10-CM | POA: Diagnosis not present

## 2024-05-19 DIAGNOSIS — Z8619 Personal history of other infectious and parasitic diseases: Secondary | ICD-10-CM | POA: Diagnosis not present

## 2024-05-19 DIAGNOSIS — K922 Gastrointestinal hemorrhage, unspecified: Secondary | ICD-10-CM | POA: Diagnosis not present

## 2024-05-19 DIAGNOSIS — R112 Nausea with vomiting, unspecified: Secondary | ICD-10-CM | POA: Diagnosis not present

## 2024-05-19 DIAGNOSIS — K9589 Other complications of other bariatric procedure: Secondary | ICD-10-CM | POA: Diagnosis not present

## 2024-05-19 DIAGNOSIS — Z434 Encounter for attention to other artificial openings of digestive tract: Secondary | ICD-10-CM | POA: Diagnosis not present

## 2024-05-19 DIAGNOSIS — Z6823 Body mass index (BMI) 23.0-23.9, adult: Secondary | ICD-10-CM | POA: Diagnosis not present

## 2024-05-19 DIAGNOSIS — Z9884 Bariatric surgery status: Secondary | ICD-10-CM | POA: Diagnosis not present

## 2024-05-19 DIAGNOSIS — F319 Bipolar disorder, unspecified: Secondary | ICD-10-CM | POA: Diagnosis not present

## 2024-05-19 DIAGNOSIS — I10 Essential (primary) hypertension: Secondary | ICD-10-CM | POA: Diagnosis not present

## 2024-05-19 DIAGNOSIS — K449 Diaphragmatic hernia without obstruction or gangrene: Secondary | ICD-10-CM | POA: Diagnosis not present

## 2024-05-19 DIAGNOSIS — S31101A Unspecified open wound of abdominal wall, left upper quadrant without penetration into peritoneal cavity, initial encounter: Secondary | ICD-10-CM | POA: Diagnosis not present

## 2024-05-19 DIAGNOSIS — X58XXXA Exposure to other specified factors, initial encounter: Secondary | ICD-10-CM | POA: Diagnosis not present

## 2024-05-20 DIAGNOSIS — I10 Essential (primary) hypertension: Secondary | ICD-10-CM | POA: Diagnosis not present

## 2024-05-20 DIAGNOSIS — K9429 Other complications of gastrostomy: Secondary | ICD-10-CM | POA: Diagnosis not present

## 2024-05-20 DIAGNOSIS — Z6823 Body mass index (BMI) 23.0-23.9, adult: Secondary | ICD-10-CM | POA: Diagnosis not present

## 2024-05-20 DIAGNOSIS — R109 Unspecified abdominal pain: Secondary | ICD-10-CM | POA: Diagnosis not present

## 2024-05-20 DIAGNOSIS — K219 Gastro-esophageal reflux disease without esophagitis: Secondary | ICD-10-CM | POA: Diagnosis not present

## 2024-05-20 DIAGNOSIS — R112 Nausea with vomiting, unspecified: Secondary | ICD-10-CM | POA: Diagnosis not present

## 2024-05-20 DIAGNOSIS — X58XXXA Exposure to other specified factors, initial encounter: Secondary | ICD-10-CM | POA: Diagnosis not present

## 2024-05-20 DIAGNOSIS — F419 Anxiety disorder, unspecified: Secondary | ICD-10-CM | POA: Diagnosis not present

## 2024-05-20 DIAGNOSIS — E669 Obesity, unspecified: Secondary | ICD-10-CM | POA: Diagnosis not present

## 2024-05-20 DIAGNOSIS — Z87440 Personal history of urinary (tract) infections: Secondary | ICD-10-CM | POA: Diagnosis not present

## 2024-05-20 DIAGNOSIS — Z5986 Financial insecurity: Secondary | ICD-10-CM | POA: Diagnosis not present

## 2024-05-20 DIAGNOSIS — K449 Diaphragmatic hernia without obstruction or gangrene: Secondary | ICD-10-CM | POA: Diagnosis not present

## 2024-05-20 DIAGNOSIS — Z8619 Personal history of other infectious and parasitic diseases: Secondary | ICD-10-CM | POA: Diagnosis not present

## 2024-05-20 DIAGNOSIS — Z7901 Long term (current) use of anticoagulants: Secondary | ICD-10-CM | POA: Diagnosis not present

## 2024-05-20 DIAGNOSIS — G4700 Insomnia, unspecified: Secondary | ICD-10-CM | POA: Diagnosis not present

## 2024-05-20 DIAGNOSIS — S31101A Unspecified open wound of abdominal wall, left upper quadrant without penetration into peritoneal cavity, initial encounter: Secondary | ICD-10-CM | POA: Diagnosis not present

## 2024-05-20 DIAGNOSIS — F319 Bipolar disorder, unspecified: Secondary | ICD-10-CM | POA: Diagnosis not present

## 2024-05-20 DIAGNOSIS — G894 Chronic pain syndrome: Secondary | ICD-10-CM | POA: Diagnosis not present

## 2024-05-21 DIAGNOSIS — G4700 Insomnia, unspecified: Secondary | ICD-10-CM | POA: Diagnosis not present

## 2024-05-21 DIAGNOSIS — S31101A Unspecified open wound of abdominal wall, left upper quadrant without penetration into peritoneal cavity, initial encounter: Secondary | ICD-10-CM | POA: Diagnosis not present

## 2024-05-21 DIAGNOSIS — Z7901 Long term (current) use of anticoagulants: Secondary | ICD-10-CM | POA: Diagnosis not present

## 2024-05-21 DIAGNOSIS — K219 Gastro-esophageal reflux disease without esophagitis: Secondary | ICD-10-CM | POA: Diagnosis not present

## 2024-05-21 DIAGNOSIS — F319 Bipolar disorder, unspecified: Secondary | ICD-10-CM | POA: Diagnosis not present

## 2024-05-21 DIAGNOSIS — X58XXXA Exposure to other specified factors, initial encounter: Secondary | ICD-10-CM | POA: Diagnosis not present

## 2024-05-21 DIAGNOSIS — Z6823 Body mass index (BMI) 23.0-23.9, adult: Secondary | ICD-10-CM | POA: Diagnosis not present

## 2024-05-21 DIAGNOSIS — F419 Anxiety disorder, unspecified: Secondary | ICD-10-CM | POA: Diagnosis not present

## 2024-05-21 DIAGNOSIS — K449 Diaphragmatic hernia without obstruction or gangrene: Secondary | ICD-10-CM | POA: Diagnosis not present

## 2024-05-21 DIAGNOSIS — Z5986 Financial insecurity: Secondary | ICD-10-CM | POA: Diagnosis not present

## 2024-05-21 DIAGNOSIS — I10 Essential (primary) hypertension: Secondary | ICD-10-CM | POA: Diagnosis not present

## 2024-05-21 DIAGNOSIS — Z8619 Personal history of other infectious and parasitic diseases: Secondary | ICD-10-CM | POA: Diagnosis not present

## 2024-05-21 DIAGNOSIS — R109 Unspecified abdominal pain: Secondary | ICD-10-CM | POA: Diagnosis not present

## 2024-05-21 DIAGNOSIS — G894 Chronic pain syndrome: Secondary | ICD-10-CM | POA: Diagnosis not present

## 2024-05-21 DIAGNOSIS — R112 Nausea with vomiting, unspecified: Secondary | ICD-10-CM | POA: Diagnosis not present

## 2024-05-21 DIAGNOSIS — E669 Obesity, unspecified: Secondary | ICD-10-CM | POA: Diagnosis not present

## 2024-05-21 DIAGNOSIS — Z87440 Personal history of urinary (tract) infections: Secondary | ICD-10-CM | POA: Diagnosis not present

## 2024-05-21 DIAGNOSIS — K9429 Other complications of gastrostomy: Secondary | ICD-10-CM | POA: Diagnosis not present

## 2024-05-22 DIAGNOSIS — F419 Anxiety disorder, unspecified: Secondary | ICD-10-CM | POA: Diagnosis not present

## 2024-05-22 DIAGNOSIS — R112 Nausea with vomiting, unspecified: Secondary | ICD-10-CM | POA: Diagnosis not present

## 2024-05-22 DIAGNOSIS — Z8619 Personal history of other infectious and parasitic diseases: Secondary | ICD-10-CM | POA: Diagnosis not present

## 2024-05-22 DIAGNOSIS — G4700 Insomnia, unspecified: Secondary | ICD-10-CM | POA: Diagnosis not present

## 2024-05-22 DIAGNOSIS — K449 Diaphragmatic hernia without obstruction or gangrene: Secondary | ICD-10-CM | POA: Diagnosis not present

## 2024-05-22 DIAGNOSIS — Z6823 Body mass index (BMI) 23.0-23.9, adult: Secondary | ICD-10-CM | POA: Diagnosis not present

## 2024-05-22 DIAGNOSIS — E669 Obesity, unspecified: Secondary | ICD-10-CM | POA: Diagnosis not present

## 2024-05-22 DIAGNOSIS — K9429 Other complications of gastrostomy: Secondary | ICD-10-CM | POA: Diagnosis not present

## 2024-05-22 DIAGNOSIS — S31101A Unspecified open wound of abdominal wall, left upper quadrant without penetration into peritoneal cavity, initial encounter: Secondary | ICD-10-CM | POA: Diagnosis not present

## 2024-05-22 DIAGNOSIS — R109 Unspecified abdominal pain: Secondary | ICD-10-CM | POA: Diagnosis not present

## 2024-05-22 DIAGNOSIS — K219 Gastro-esophageal reflux disease without esophagitis: Secondary | ICD-10-CM | POA: Diagnosis not present

## 2024-05-22 DIAGNOSIS — Z5986 Financial insecurity: Secondary | ICD-10-CM | POA: Diagnosis not present

## 2024-05-22 DIAGNOSIS — F319 Bipolar disorder, unspecified: Secondary | ICD-10-CM | POA: Diagnosis not present

## 2024-05-22 DIAGNOSIS — G894 Chronic pain syndrome: Secondary | ICD-10-CM | POA: Diagnosis not present

## 2024-05-22 DIAGNOSIS — Z87440 Personal history of urinary (tract) infections: Secondary | ICD-10-CM | POA: Diagnosis not present

## 2024-05-22 DIAGNOSIS — Z7901 Long term (current) use of anticoagulants: Secondary | ICD-10-CM | POA: Diagnosis not present

## 2024-05-22 DIAGNOSIS — X58XXXA Exposure to other specified factors, initial encounter: Secondary | ICD-10-CM | POA: Diagnosis not present

## 2024-05-22 DIAGNOSIS — I10 Essential (primary) hypertension: Secondary | ICD-10-CM | POA: Diagnosis not present

## 2024-05-23 DIAGNOSIS — E669 Obesity, unspecified: Secondary | ICD-10-CM | POA: Diagnosis not present

## 2024-05-23 DIAGNOSIS — F319 Bipolar disorder, unspecified: Secondary | ICD-10-CM | POA: Diagnosis not present

## 2024-05-23 DIAGNOSIS — Z6823 Body mass index (BMI) 23.0-23.9, adult: Secondary | ICD-10-CM | POA: Diagnosis not present

## 2024-05-23 DIAGNOSIS — S31101A Unspecified open wound of abdominal wall, left upper quadrant without penetration into peritoneal cavity, initial encounter: Secondary | ICD-10-CM | POA: Diagnosis not present

## 2024-05-23 DIAGNOSIS — R112 Nausea with vomiting, unspecified: Secondary | ICD-10-CM | POA: Diagnosis not present

## 2024-05-23 DIAGNOSIS — K219 Gastro-esophageal reflux disease without esophagitis: Secondary | ICD-10-CM | POA: Diagnosis not present

## 2024-05-23 DIAGNOSIS — Z7901 Long term (current) use of anticoagulants: Secondary | ICD-10-CM | POA: Diagnosis not present

## 2024-05-23 DIAGNOSIS — F419 Anxiety disorder, unspecified: Secondary | ICD-10-CM | POA: Diagnosis not present

## 2024-05-23 DIAGNOSIS — G894 Chronic pain syndrome: Secondary | ICD-10-CM | POA: Diagnosis not present

## 2024-05-23 DIAGNOSIS — K449 Diaphragmatic hernia without obstruction or gangrene: Secondary | ICD-10-CM | POA: Diagnosis not present

## 2024-05-23 DIAGNOSIS — Z87440 Personal history of urinary (tract) infections: Secondary | ICD-10-CM | POA: Diagnosis not present

## 2024-05-23 DIAGNOSIS — R109 Unspecified abdominal pain: Secondary | ICD-10-CM | POA: Diagnosis not present

## 2024-05-23 DIAGNOSIS — K9429 Other complications of gastrostomy: Secondary | ICD-10-CM | POA: Diagnosis not present

## 2024-05-23 DIAGNOSIS — Z5986 Financial insecurity: Secondary | ICD-10-CM | POA: Diagnosis not present

## 2024-05-23 DIAGNOSIS — Z8619 Personal history of other infectious and parasitic diseases: Secondary | ICD-10-CM | POA: Diagnosis not present

## 2024-05-23 DIAGNOSIS — X58XXXA Exposure to other specified factors, initial encounter: Secondary | ICD-10-CM | POA: Diagnosis not present

## 2024-05-23 DIAGNOSIS — I10 Essential (primary) hypertension: Secondary | ICD-10-CM | POA: Diagnosis not present

## 2024-05-23 DIAGNOSIS — G4700 Insomnia, unspecified: Secondary | ICD-10-CM | POA: Diagnosis not present

## 2024-05-27 ENCOUNTER — Ambulatory Visit: Admitting: Family

## 2024-05-30 DIAGNOSIS — R112 Nausea with vomiting, unspecified: Secondary | ICD-10-CM | POA: Diagnosis not present

## 2024-07-15 ENCOUNTER — Ambulatory Visit: Admitting: Family

## 2024-07-26 NOTE — Progress Notes (Signed)
 [facility] Postop Visit/ Established Patient [facility] [address] [place], [place] [address] [phone] [email or web address]     Surgeon:  [name], MD    Telemedicine Encounter  Telemedicine visit note:  At the beginning of the telemedicine visit, the provider, Afton Carducci, MMS, PA-C, introduced herself and the patient confirmed name and date-of-birth, Victoria Andrews, [birth year]. The patient has stated verbally that she is in a safe and private place at location home and is able to properly hear/see the provider and willing to proceed with a medical discussion encounter with their provider. Patient has confirmed that she is within the state of [place]  at present time of encounter and there is not another person accompanying her .  Additionally, the capabilities and limitations of telemedicine were touched on and there was a mutual conclusion that it is appropriate for the patient's circumstances/symptoms. A phone number for immediate contact in the event of a technology failure in order to complete telemedicine encounter was collected.    Surgical Procedures: (1) Laparoscopic sleeve gastrectomy performed August 17, 2015 by Dr. Rolan, MD.  Pre-operative weight/ Body Mass Index:  270 lbs and BMI 46.43 kg/m2.     (2) Lap cholecystectomy and laparoscopic hiatal hernia repair by Dr. Rolan, MD on 10/27/15.   (3) Lap hiatal hernia repair by Dr. Ronde, MD on 11/11/22.   (4) Laparoscopic Jejunostomy feeding tube insertion and EGD with botox injection into the pylorus (80 units) by Dr. Ronde, MD on 02/07/23.   (5) Diagnostic Laparoscopy and Laparoscopic revision of J-tube to a roux-en-Y Jejunostomy tube by Dr. Ronde, MD on 02/12/23.  (6)  Laparoscopic conversion of sleeve gastrectomy to RYGB with EGD (ante, ante, 50 cm roux limb, 30 cm BP limb), Laparoscopic redo Hiatal Hernia Repair, Removal of jejunostomy tube, and  Laparoscopic gastrostomy tube insertion in the gastric remnant by Dr. Ronde, MD on 05/19/24. Pre-operative weight/ Body Mass Index:  136 lbs and BMI 22.63 kg/m2.    Current vitals: There were no vitals taken for this visit. Unavailable.  Assessment & Plan Nausea and dry heaving following bariatric surgery revision and hiatal hernia repair Nausea and dry heaving for 1 week. Current antiemetics include Compazine , Zofran , Phenergan , and Ativan . Scopolamine  patches available but unused. Remeron  ineffective. - Refilled Ativan  prescription, sent to [facility] in [place]. - Initiated scopolamine  patches for nausea. - Refilled Zofran  with dissolvable tablets for G tube.  Chronic abdominal pain following bariatric surgery and hiatal hernia repair Chronic abdominal pain persists post-surgery, affecting posture and daily activities. Previous pain specialist consultation unsuccessful. - Consulted Doctor Ronde for pain medication refill. - Referred to new pain specialist, avoiding [place] per patient request.  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 Current regimen includes protein shakes and free water flushes. - Continued G tube feeds for nutrition. - Maintained free water flushes for hydration.  History of Present Illness Victoria Andrews is a 28 year old female who presents with dry heaving and nausea following recent gastric bypass surgery and hiatal hernia repair. She is accompanied by her mother.  She underwent a revision of sleeve gastrectomy to gastric bypass and a redo of a hiatal hernia repair. Following these procedures, she has been experiencing dry heaving for the past week, occurring all day and night without actual vomiting, only producing spit and foam. She also experiences frequent burping and gas.  Her nausea has  been increasing, peaking recently. She has tried various  medications for nausea, including Ativan , Compazine , Zofran , and Phenergan . Ativan  has been the most effective, but she ran out of it today. She takes Compazine  every six hours, Zofran  every six to eight hours, and Phenergan  once or twice a day, usually at night due to its sedative effects.  She has been using her G tube for medication and nutrition, consuming three protein shakes a day through the tube for the past week since she cannot take in much by mouth. She was previously trying to wean off the G tube by eating orally but had to increase G tube feeds due to the recent setback with nausea and dry heaving.  She is also experiencing significant abdominal pain, which has been ongoing for at least several days. She has been out of her pain medication, which is prescribed as 5mg  by mouth twice a day as needed, 60 tablets, and the prescription was filled on July 1st for 30 days.  She has a history of using scopolamine  patches for nausea but has not applied one recently. Remeron  does not help her symptoms. She also uses free water flushes to maintain hydration and takes Pepsid twice a day.  Results  Lab Results  Component Value Date   WBC 9.2 05/23/2024   WBC 9.7 05/21/2024   RBC 3.71 (L) 05/23/2024   RBC 3.79 (L) 05/21/2024   HGB 12.6 05/23/2024   HGB 12.7 05/21/2024   HCT 35.9 05/23/2024   HCT 37.1 05/21/2024   PLT 189 05/23/2024   PLT 188 05/21/2024   NA 142 05/23/2024   NA 143 05/22/2024   K 4.1 05/23/2024   K 4.0 05/22/2024   CL 106 05/23/2024   CL 107 05/22/2024   CO2 29.0 05/23/2024   CO2 26.0 05/22/2024   ANIONGAP 7 05/23/2024   ANIONGAP 10 05/22/2024   BUN 5 (L) 05/23/2024   BUN 6 (L) 05/22/2024   CREATININE 0.51 (L) 05/23/2024   CREATININE 0.52 (L) 05/22/2024   BCR 10 05/23/2024   BCR 12 05/22/2024   EGFR >90 05/23/2024   EGFR >90 05/22/2024   GLUF 86 03/03/2019   CALCIUM  8.6 (L) 05/23/2024   CALCIUM  9.0 05/22/2024   ALBUMIN  2.9 (L) 05/22/2024   ALBUMIN  4.6  08/07/2023   PROT 5.7 05/22/2024   PROT 8.1 08/07/2023   BILITOT 0.5 05/22/2024   BILITOT 0.9 08/07/2023   AST 57 (H) 05/22/2024   AST 16 08/07/2023   ALT 79 (H) 05/22/2024   ALT 13 08/07/2023   ALKPHOS 52 05/22/2024   ALKPHOS 97 08/07/2023   IRON 27 (L) 01/27/2023   TIBC 245 (L) 01/27/2023   LABIRON 11 (L) 01/27/2023   ZINC  77 01/27/2023   Lab Results  Component Value Date   COPPER  79 01/27/2023   VITAMINA 16.5 (L) 01/27/2023   VITDTOTAL 23.3 01/27/2023   VITAME 7.8 01/27/2023   VITAMINK1 0.11 01/27/2023   VTB1 112 01/27/2023   VITAMINB12 542 01/27/2023       Past Medical History  Past Medical History[1]   Past Surgical History  Past Surgical History[2]   Allergies  Dermabond [2-octyl cyanoacrylate], Adhesive, Ciprofloxacin hcl, and Other   Medications    Current Medications[3]   Family History  Family History[4]   Social History  Social History[5]   Review of Systems Review of Systems  Constitutional: Negative.   Respiratory: Negative.    Cardiovascular: Negative.   Gastrointestinal: Negative.   Genitourinary: Negative.   Musculoskeletal: Negative.  Skin: Negative.   Neurological: Negative.   Psychiatric/Behavioral: Negative.     MBSAQIP Data Base  BPAP or CPAP for OSA? No   Hypertension medication? No Please List:    Diabetes medication? No Please List:            Vital Signs  There were no vitals taken for this visit.  General Exam: General Appearance: Patient does not exhibit signs of acute distress. Cooperative. Well developed and well-nourished, hydrated. Eyes: Conjunctiva normal, pupils normal in size, no eyelid edema or discoloration. Neuro Exam: Alert and oriented x 3 Psychology:   Eye Contact: normal. Mood: normal. Affect: normal. Speech: normal. Cardiac: Pulse was not calculated for this visit Lung: Normal shape and expansion without the use of accessory muscle. No auditory wheezing. Normal respiratory rate.   Abdomen: Patient does not feel that abdomen is distended. Patient does not report tenderness over abdominal quadrants. Skin: Normal, no rash or skin lesions on exposed skin. Extremities: Patient does not feel that lower or upper extremities have significant edema nor unilateral abnormality. Denies any pitting edema. Patient demonstrates good movement of all limbs.   Problem List  Problem List[6]      The patient reports they are physically located in [place]  and is currently: at home. I conducted a audio/video visit. I spent 15 minutes on the video call with the patient. I spent an additional 15 minutes on pre- and post-visit activities on the date of service .     We spent 15 Minutes with the patient, with greater than 50% spent discussing post-op recovery, providing nutritional and exercise counseling, and reviewing labs and medications.               [1] Past Medical History: Diagnosis Date  . AKI (acute kidney injury) 2016  . Anxiety   . Bipolar 1 disorder      . Cholelithiasis   . Difficult intravenous access   . GERD (gastroesophageal reflux disease)   . Hiatal hernia   . Hx of Clostridium difficile infection    x3  . Kidney stones 2016  . Pancreatitis ([facility]-HCC)   . PONV (postoperative nausea and vomiting)   . Recurrent UTI (urinary tract infection) 10/26/2015  [2] Past Surgical History: Procedure Laterality Date  . CHOLECYSTECTOMY    . COMBINED REDUCTION MAMMAPLASTY W/ ABDOMINOPLASTY Bilateral 2018  . COSMETIC SURGERY    . HERNIA REPAIR    . 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 PR CYSTOSCOPY,INSERT URETERAL STENT Bilateral 10/24/2015   Procedure: CYSTOSCOPY, INSERTION OF BILATERAL URETERAL STENTS, RIGHT URETEROSCOPY, RIGHT URETERAL DILATATION, RIGHT URETERAL BASKET STONE EXTRACTION;  Surgeon: Norleen Steffan Linsey, MD;  Location: [facility];  Service: Urology  . PR ESOPHAGEAL MOTILITY STUDY, MANOMETRY N/A 08/24/2019   Procedure:  ESOPHAGEAL MOTILITY STUDY W/INT & REP;  Surgeon: Nurse-Based Giproc;  [facility];  Service: Gastroenterology  . PR GERD TST W/ MUCOS IMPEDE ELECTROD,>1HR N/A 08/24/2019   Procedure: ESOPHAGEAL FUNCTION TEST, GASTROESOPHAGEAL REFLUX TEST W/ NASAL CATHETER INTRALUMINAL IMPEDANCE ELECTRODE(S) PLACEMENT, RECORDING, ANALYSIS AND INTERPRETATION; PROLONGED;  Surgeon: Nurse-Based Giproc;  [facility];  Service: Gastroenterology  . PR LAP GASTRIC BYPASS/ROUX-EN-Y N/A 05/19/2024   Procedure: LAPAROSCOPIC CONVERSION OF SLEEVE GASTRECTOMY TO ROUX EN Y GASTRIC BYPASS, HIATAL HERNIA REPAIR;  Surgeon: Fredericka Morna Mallick, MD;  Location: [facility];  Service: General Surgery  . PR LAP, GAST RESTRICT PROC, LONGITUDINAL GASTRECTOMY N/A 08/17/2015   Procedure: LAPAROSCOPIC SLEEVE GASTRECTOMY ;  Surgeon: Glendia Dale Marshall, MD;  Location: [facility];  Service: General Surgery  . PR LAP, REPAIR PARAESOPHAGEAL HERNIA, INCL FUNDOPLASTY W/O MESH N/A 10/27/2015   Procedure: LAPAROSCOPIC HIATAL HERNIA REPAIR.  Peritineal lavage  ;  Surgeon: Glendia Dale Marshall, MD;  Location: [facility];  Service: General Surgery  . PR LAP, REPAIR PARAESOPHAGEAL HERNIA, INCL FUNDOPLASTY W/O MESH N/A 11/11/2022   Procedure: LAPAROSCOPIC REDO OF HIATAL HERNIA REPAIR;  Surgeon: Fredericka Morna Mallick, MD;  [facility];  Service: General Surgery  . PR LAP, SURG JEJUNOSTOMY N/A 02/07/2023   Procedure: LAPAROSCOPIC JEJUNOSTOMY TUBE PLACEMENT;  Surgeon: Fredericka Morna Mallick, MD;  [facility];  Service: General Surgery  . PR LAP, SURG JEJUNOSTOMY N/A 05/19/2024   Procedure: REMOVAL OF JEJUNOSTOMY TUBE AND INSERTION OF GASTROSTOMY TUBE INTO THE GASTRIC REMNANT;  Surgeon: Fredericka Morna Mallick, MD;  [facility]: [facility];  Service: General Surgery  . PR LAP,CHOLECYSTECTOMY N/A 10/27/2015   Procedure: LAPAROSCOPIC CHOLECYSTECTOMY;  Surgeon: Glendia Dale Marshall, MD;  [facility];  Service: General Surgery  . PR  LAP,DIAGNOSTIC ABDOMEN N/A 02/12/2023   Procedure: DIAGNOSTIC LAPAROSCOPY WITH REVISION TO ROUX EN Y JEJUNOSTOMY;  Surgeon: Fredericka Morna Mallick, MD;  [facility];  Service: General Surgery  . PR UP GI ENDOSCOPY,BALL DIL,30MM N/A 12/18/2022   Procedure: ESOPHAGOGASTRODUODENOSCOPY WITH BALLOON DILATATION;  Surgeon: Fredericka Morna Mallick, MD;  [facility];  Service: Gastroenterology  . PR UPPER GI ENDOSCOPY,BIOPSY N/A 10/22/2015   Procedure: ESOPHAGOGASTRODUODENOSCOPY WITH BIOPSIES;  Surgeon: Morton Jerrell High, MD;  [facility];  Service: Gastroenterology  . PR UPPER GI ENDOSCOPY,DIAGNOSIS N/A 02/03/2019   Procedure: ESOPHAGOGASTRODUODENOSCOPY WITH BALLOON DILATION AND  MEDICATION INJECTION;  Surgeon: Maude Julieanne Moulding, MD;  [facility];  Service: Gastroenterology  . PR UPPER GI ENDOSCOPY,W/DIR SUBMUC INJ N/A 02/07/2023   Procedure: ESOPHAGOGASTRODUODENOSCOPY WITH DILATION AND MEDICATION INJECTION;  Surgeon: Fredericka Morna Mallick, MD;  [facility];  Service: General Surgery  . WISDOM TOOTH EXTRACTION    [3] Current Outpatient Medications  Medication Sig Dispense Refill  . escitalopram  oxalate (LEXAPRO ) 10 MG tablet 2 tablets (20 mg total) by J-Tube route every evening.    . famotidine  (PEPCID ) 20 MG tablet 1 tablet (20 mg total) by J-Tube route two (2) times a day.    . LORazepam  (ATIVAN ) 1 MG tablet Take 1 tablet (1 mg total) by mouth Three (3) times a day as needed (nausea and vomiting). 30 tablet 0  . multivitamin-min-iron-FA-vit K (BARIATRIC MULTIVITAMINS) 45 mg iron- 800 mcg-120 mcg cap 1 tablet by J-Tube route daily.    SABRA omeprazole (PRILOSEC) 20 MG capsule Take 1 capsule (20 mg total) by mouth two (2) times a day. Take for at least 3 months postoperatively 180 capsule 0  . ondansetron  (ZOFRAN -ODT) 8 MG disintegrating tablet Take 1 tablet (8 mg total) by mouth every eight (8) hours as needed for nausea (vomiting). 30 tablet 2  .  oxyCODONE  (ROXICODONE ) 5 MG immediate release tablet Take 1 tablet (5 mg total) by mouth two (2) times a day as needed for pain. 60 tablet 0  . prochlorperazine  (COMPAZINE ) 10 MG tablet TAKE 1 TABLET (10 MG TOTAL) BY MOUTH EVERY 6 HOURS AS NEEDED FOR NAUSEA 120 tablet 0  . promethazine  (PHENERGAN ) 25 MG tablet Take 1 tablet (25 mg total) by mouth every six (6) hours as needed for nausea. TAKE 1 TABLET (25 MG TOTAL) BY MOUTH EVERY SIX (6) HOURS AS NEEDED FOR NAUSEA. 60 tablet  1  . scopolamine  (TRANSDERM-SCOP) 1 mg over 3 days Place 1 patch (1 mg total) on the skin every third day. 10 patch 12  . traZODone  (DESYREL ) 50 MG tablet Take 1 tablet by mouth nightly. (Patient taking differently: 1 tablet (50 mg total) by J-Tube route nightly.) 30 tablet 0   No current facility-administered medications for this visit.  [4] Family History Problem Relation Age of Onset  . Atrial fibrillation Mother   . Breast cancer Mother   . Clotting disorder Father        dvt due to vascular malformation and not clotting issues  . Kidney disease Father        stones  . Prostate cancer Father   . No Known Problems Brother   . Muscular dystrophy Maternal Grandmother   . Diabetes Maternal Grandfather   . Diabetes Paternal Grandmother   . No Known Problems Paternal Grandfather   . Breast cancer Paternal Cousin   [5] Social History Socioeconomic History  . Marital status: Single  Tobacco Use  . Smoking status: Never  . Smokeless tobacco: Never  Vaping Use  . Vaping status: Never Used  Substance and Sexual Activity  . Alcohol use: Not Currently  . Drug use: Not Currently    Types: Marijuana  . Sexual activity: Yes    Partners: Male    Birth control/protection: Condom    Comment: occasional condom   Social Drivers of Corporate investment banker Strain: Low Risk  (05/20/2024)   Overall Financial Resource Strain (CARDIA)   . Difficulty of Paying Living Expenses: Not hard at all  Recent Concern: Financial  Resource Strain - Medium Risk (02/25/2024)   Received from [facility]   Overall Financial Resource Strain (CARDIA)   . Difficulty of Paying Living Expenses: Somewhat hard  Food Insecurity: No Food Insecurity (05/20/2024)   Hunger Vital Sign   . Worried About Programme researcher, broadcasting/film/video in the Last Year: Never true   . Ran Out of Food in the Last Year: Never true  Recent Concern: Food Insecurity - Food Insecurity Present (02/25/2024)   Received from [facility]   Hunger Vital Sign   . Within the past 12 months, you worried that your food would run out before you got the money to buy more.: Sometimes true   . Within the past 12 months, the food you bought just didn't last and you didn't have money to get more.: Sometimes true  Transportation Needs: No Transportation Needs (05/20/2024)   PRAPARE - Transportation   . Lack of Transportation (Medical): No   . Lack of Transportation (Non-Medical): No  Physical Activity: Unknown (11/11/2022)   Exercise Vital Sign   . Minutes of Exercise per Session: 0 min  Stress: Stress Concern Present (11/11/2022)   [employer] of Occupational Health - Occupational Stress Questionnaire   . Feeling of Stress : Rather much  Social Connections: Moderately Isolated (11/11/2022)   Social Connection and Isolation Panel   . Frequency of Communication with Friends and Family: More than three times a week   . Frequency of Social Gatherings with Friends and Family: More than three times a week   . Attends Religious Services: Never   . Active Member of Clubs or Organizations: No   . Attends Banker Meetings: Never   . Marital Status: Living with partner  Housing: Low Risk  (05/20/2024)   Housing   . Within the past 12 months, have you ever stayed:  outside, in a car, in a tent, in an overnight shelter, or temporarily in someone else's home (i.e. couch-surfing)?: No   . Are you worried about losing your housing?: No  Recent  Concern: Housing - High Risk (02/25/2024)   Received from [facility]   Housing Stability Vital Sign   . In the last 12 months, was there a time when you were not able to pay the mortgage or rent on time?: Yes   . In the past 12 months, how many times have you moved where you were living?: 1   . At any time in the past 12 months, were you homeless or living in a shelter (including now)?: No  [6] Patient Active Problem List Diagnosis  . Nausea & vomiting  . Sinus bradycardia  . ARF (acute renal failure)  . Hydronephrosis with urinary obstruction due to ureteral calculus  . Recurrent UTI (urinary tract infection)  . Hx of Clostridium difficile infection  . Gastroesophageal reflux disease without esophagitis  . Orthostatic hypotension  . Primary insomnia  . S/P gastric bypass  . Prolonged QT interval  . Syncope  . Dehydration

## 2024-08-09 DIAGNOSIS — F4312 Post-traumatic stress disorder, chronic: Secondary | ICD-10-CM | POA: Diagnosis not present

## 2024-08-12 NOTE — Progress Notes (Signed)
 Bariatric Post-operative Note [facility] [address] [place], [place] [address] [phone] [email or web address]    [name], MD       LAPAROSCOPIC CONVERSION OF SLEEVE GASTRECTOMY TO ROUX EN Y GASTRIC BYPASS, HIATAL HERNIA REPAIR, REMOVAL OF JEJUNOSTOMY TUBE AND INSERTION OF GASTROSTOMY TUBE INTO THE GASTRIC REMNANT by Dr. Morna Alyce Ronde, MD on 05/19/2024.  G tube in place-- Continue bolus feeds.  Change dressing to LUQ wound daily.  No eliquis at discharge. BMI is 23.08.  Pre-operative weight/ Body Mass Index:  138 lbs and BMI 23.08 kg/m2.    Current vitals: BP 94/61 (BP Position: Sitting)   Pulse 76   Ht 165.1 cm (5' 5)   Wt 55.5 kg (122 lb 6.4 oz)   SpO2 99%   BMI 20.37 kg/m .       Assessment & Plan Status post gastric bypass with feeding tube and post-surgical complications (nausea, vomiting, abdominal pain, altered bowel habits) Post-surgical complications include persistent nausea, vomiting, abdominal pain, and altered bowel habits. Nausea and vomiting episodes have decreased in frequency but remain significant, with severe episodes lasting up to a week. Abdominal pain is severe, constant, and similar to labor pain, affecting daily activities. Differential diagnosis includes potential internal hernia or bowel twisting due to the feeding tube. - Advise ER visit for CT scan during severe pain episodes to assess for bowel twisting or internal hernia. - Refill Compazine  prescription.  Feeding tube leakage Feeding tube leakage noted around the site, with medication and formula leaking out. Potential for leakage to contribute to medication loss was noted. - Adjust feeding tube to minimize leakage. - Use technique of bending the tube before removing the syringe to prevent backflow.   Chief Complaint:  F/u bariatric surgery/obesity   History of Present Illness Victoria Andrews is a 28 year old female who presents with  ongoing nausea, vomiting, and abdominal pain following recent surgery.  She experiences ongoing episodes of nausea and vomiting, although the frequency has decreased since her surgery. She has vomited actual food only once since the procedure but reports that she was very sick and could not stop throwing up on a recent Tuesday. Despite fewer episodes, she continues to have significant nausea and takes medication frequently for it.  She describes a persistent issue with her feeding tube, noting that 20 to 30 minutes after using it, the contents sometimes leak out around the skin, particularly when administering her morning medications. The tube has not been replaced since it was initially placed during surgery approximately three months ago.  She experiences excruciating abdominal pain, particularly when vomiting. The pain is severe, located in the stomach, and radiates to her back. It is so intense that she feels like she is 'contracting' and has to remain curled up. The pain can last for a week and significantly impacts her daily activities, causing her to walk hunched over and feel like she is 'ninety'.  She reports frequent bowel movements, occurring three to four times daily, even on days when she does not eat much. She describes her stools as 'insane' and experiences nausea during bowel movements. She also notes that she continues to pass formula through her stools.  Her current medications include Compazine  and Zofran  for nausea, and oxycodone  for pain management. She takes Compazine  more frequently than Phenergan , which she uses primarily at night due to its sedative effects. She has recently run out of oxycodone , which she takes at least four times a day  along with Tylenol  for pain relief.      Objective     Past Medical History  Past Medical History[1]   Past Surgical History  Past Surgical History[2]   Allergies  Dermabond [2-octyl cyanoacrylate], Adhesive, Ciprofloxacin hcl,  and Other   Medications    Current Medications[3]   Family History  Family History[4]   Social History  Social History[5]   Review of Systems  Review of Systems  Gastrointestinal:  Positive for abdominal pain, nausea and vomiting.      MBSAQIP Data Base  BPAP or CPAP for OSA? No  Hypertension medication? No  Diabetes medication? No            Vital Signs  BP 94/61 (BP Position: Sitting)   Pulse 76   Ht 165.1 cm (5' 5)   Wt 55.5 kg (122 lb 6.4 oz)   SpO2 99%   BMI 20.37 kg/m    Body Composition  Fat %:  25.2 % Fat Mass: 30.8 lbs FFM:  91.6 lbs     Physical Exam   Constitutional  No acute distress, well nourished   Neurological: Alert, oriented, normal speech   Cardiovascular  No JVD, cyanosis or pallor   Pulmonary  Normal effort, no distress   Abdomen Non-distended, normal motion   Skin  Intact skin/nails, no rashes or bruising   Extremities No edema or muscle wasting   Psych Normal judgement, affect, content   Other      Lab Results  Component Value Date   WBC 9.2 05/23/2024   WBC 9.7 05/21/2024   RBC 3.71 (L) 05/23/2024   RBC 3.79 (L) 05/21/2024   HGB 12.6 05/23/2024   HGB 12.7 05/21/2024   HCT 35.9 05/23/2024   HCT 37.1 05/21/2024   PLT 189 05/23/2024   PLT 188 05/21/2024   NA 142 05/23/2024   NA 143 05/22/2024   K 4.1 05/23/2024   K 4.0 05/22/2024   CL 106 05/23/2024   CL 107 05/22/2024   CO2 29.0 05/23/2024   CO2 26.0 05/22/2024   ANIONGAP 7 05/23/2024   ANIONGAP 10 05/22/2024   BUN 5 (L) 05/23/2024   BUN 6 (L) 05/22/2024   CREATININE 0.51 (L) 05/23/2024   CREATININE 0.52 (L) 05/22/2024   BCR 10 05/23/2024   BCR 12 05/22/2024   EGFR >90 05/23/2024   EGFR >90 05/22/2024   GLUF 86 03/03/2019   CALCIUM  8.6 (L) 05/23/2024   CALCIUM  9.0 05/22/2024   ALBUMIN  2.9 (L) 05/22/2024   ALBUMIN  4.6 08/07/2023   PROT 5.7 05/22/2024   PROT 8.1 08/07/2023   BILITOT 0.5 05/22/2024   BILITOT 0.9 08/07/2023   AST 57 (H)  05/22/2024   AST 16 08/07/2023   ALT 79 (H) 05/22/2024   ALT 13 08/07/2023   ALKPHOS 52 05/22/2024   ALKPHOS 97 08/07/2023   IRON 27 (L) 01/27/2023   TIBC 245 (L) 01/27/2023   LABIRON 11 (L) 01/27/2023   ZINC  77 01/27/2023   Lab Results  Component Value Date   COPPER  79 01/27/2023   VITAMINA 16.5 (L) 01/27/2023   VITDTOTAL 23.3 01/27/2023   VITAME 7.8 01/27/2023   VITAMINK1 0.11 01/27/2023   VTB1 112 01/27/2023   VITAMINB12 542 01/27/2023      Problem List  Problem List[6]           [1] Past Medical History: Diagnosis Date  . AKI (acute kidney injury) 2016  . Anxiety   . Bipolar 1 disorder       .  Cholelithiasis   . Difficult intravenous access   . GERD (gastroesophageal reflux disease)   . Hiatal hernia   . Hx of Clostridium difficile infection    x3  . Kidney stones 2016  . Pancreatitis (HHS-HCC)   . PONV (postoperative nausea and vomiting)   . Recurrent UTI (urinary tract infection) 10/26/2015  [2] Past Surgical History: Procedure Laterality Date  . CHOLECYSTECTOMY    . COMBINED REDUCTION MAMMAPLASTY W/ ABDOMINOPLASTY Bilateral 2018  . COSMETIC SURGERY    . HERNIA REPAIR    . 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 PR CYSTOSCOPY,INSERT URETERAL STENT Bilateral 10/24/2015   Procedure: CYSTOSCOPY, INSERTION OF BILATERAL URETERAL STENTS, RIGHT URETEROSCOPY, RIGHT URETERAL DILATATION, RIGHT URETERAL BASKET STONE EXTRACTION;  Surgeon: Norleen Steffan Linsey, MD;  Location: [facility];  Service: Urology  . PR ESOPHAGEAL MOTILITY STUDY, MANOMETRY N/A 08/24/2019   Procedure: ESOPHAGEAL MOTILITY STUDY W/INT & REP;  Surgeon: Nurse-Based Giproc;  [facility];  Service: Gastroenterology  . PR GERD TST W/ MUCOS IMPEDE ELECTROD,>1HR N/A 08/24/2019   Procedure: ESOPHAGEAL FUNCTION TEST, GASTROESOPHAGEAL REFLUX TEST W/ NASAL CATHETER INTRALUMINAL IMPEDANCE ELECTRODE(S) PLACEMENT, RECORDING, ANALYSIS AND INTERPRETATION;  PROLONGED;  Surgeon: Nurse-Based Giproc;  [facility];  Service: Gastroenterology  . PR LAP GASTRIC BYPASS/ROUX-EN-Y N/A 05/19/2024   Procedure: LAPAROSCOPIC CONVERSION OF SLEEVE GASTRECTOMY TO ROUX EN Y GASTRIC BYPASS, HIATAL HERNIA REPAIR;  Surgeon: Fredericka Morna Mallick, MD;  Location: [facility];  Service: General Surgery  . PR LAP, GAST RESTRICT PROC, LONGITUDINAL GASTRECTOMY N/A 08/17/2015   Procedure: LAPAROSCOPIC SLEEVE GASTRECTOMY ;  Surgeon: Glendia Dale Marshall, MD;  Location: [facility];  Service: General Surgery  . PR LAP, REPAIR PARAESOPHAGEAL HERNIA, INCL FUNDOPLASTY W/O MESH N/A 10/27/2015   Procedure: LAPAROSCOPIC HIATAL HERNIA REPAIR.  Peritineal lavage  ;  Surgeon: Glendia Dale Marshall, MD;  Location: [facility];  Service: General Surgery  . PR LAP, REPAIR PARAESOPHAGEAL HERNIA, INCL FUNDOPLASTY W/O MESH N/A 11/11/2022   Procedure: LAPAROSCOPIC REDO OF HIATAL HERNIA REPAIR;  Surgeon: Fredericka Morna Mallick, MD;  Location: [facility];  Service: General Surgery  . PR LAP, SURG JEJUNOSTOMY N/A 02/07/2023   Procedure: LAPAROSCOPIC JEJUNOSTOMY TUBE PLACEMENT;  Surgeon: Fredericka Morna Mallick, MD;  Location: [facility];  Service: General Surgery  . PR LAP, SURG JEJUNOSTOMY N/A 05/19/2024   Procedure: REMOVAL OF JEJUNOSTOMY TUBE AND INSERTION OF GASTROSTOMY TUBE INTO THE GASTRIC REMNANT;  Surgeon: Fredericka Morna Mallick, MD;  Location: [facility];  Service: General Surgery  . PR LAP,CHOLECYSTECTOMY N/A 10/27/2015   Procedure: LAPAROSCOPIC CHOLECYSTECTOMY;  Surgeon: Glendia Dale Marshall, MD;  Location: [facility];  Service: General Surgery  . PR LAP,DIAGNOSTIC ABDOMEN N/A 02/12/2023   Procedure: DIAGNOSTIC LAPAROSCOPY WITH REVISION TO ROUX EN Y JEJUNOSTOMY;  Surgeon: Fredericka Morna Mallick, MD;  Location: [facility];  Service: General Surgery  . PR UP GI ENDOSCOPY,BALL DIL,30MM N/A 12/18/2022   Procedure: ESOPHAGOGASTRODUODENOSCOPY WITH BALLOON DILATATION;  Surgeon: Fredericka Morna Mallick, MD;  [facility];  Service: Gastroenterology  . PR UPPER GI ENDOSCOPY,BIOPSY N/A 10/22/2015   Procedure: ESOPHAGOGASTRODUODENOSCOPY WITH BIOPSIES;  Surgeon: Morton Jerrell High, MD;  [facility];  Service: Gastroenterology  . PR UPPER GI ENDOSCOPY,DIAGNOSIS N/A 02/03/2019   Procedure: ESOPHAGOGASTRODUODENOSCOPY WITH BALLOON DILATION AND  MEDICATION INJECTION;  Surgeon: Maude Julieanne Moulding, MD;  Location: [facility];  Service: Gastroenterology  . PR UPPER GI ENDOSCOPY,W/DIR SUBMUC INJ N/A 02/07/2023   Procedure: ESOPHAGOGASTRODUODENOSCOPY WITH DILATION AND  MEDICATION INJECTION;  Surgeon: Fredericka Morna Mallick, MD;  Location: [facility];  Service: General Surgery  . WISDOM TOOTH EXTRACTION    [3] Current Outpatient Medications  Medication Sig Dispense Refill  . escitalopram  oxalate (LEXAPRO ) 10 MG tablet 2 tablets (20 mg total) by J-Tube route every evening.    . famotidine  (PEPCID ) 20 MG tablet 1 tablet (20 mg total) by J-Tube route two (2) times a day.    . LORazepam  (ATIVAN ) 1 MG tablet Take 1 tablet (1 mg total) by mouth Three (3) times a day as needed (nausea and vomiting). 60 tablet 3  . multivitamin-min-iron-FA-vit K (BARIATRIC MULTIVITAMINS) 45 mg iron- 800 mcg-120 mcg cap 1 tablet by J-Tube route daily.    SABRA omeprazole (PRILOSEC) 20 MG capsule Take 1 capsule (20 mg total) by mouth two (2) times a day. Take for at least 3 months postoperatively 180 capsule 0  . ondansetron  (ZOFRAN -ODT) 8 MG disintegrating tablet Take 1 tablet (8 mg total) by mouth every eight (8) hours as needed for nausea (vomiting). 90 tablet 11  . oxyCODONE  (ROXICODONE ) 5 MG immediate release tablet Take 1 tablet (5 mg total) by mouth two (2) times a day as needed for pain. 60 tablet 0  . prochlorperazine  (COMPAZINE ) 10 MG tablet TAKE 1 TABLET (10 MG TOTAL) BY MOUTH EVERY 6 HOURS AS NEEDED FOR NAUSEA 120 tablet 0  . promethazine  (PHENERGAN ) 25 MG tablet Take 1 tablet (25 mg total) by mouth every six (6) hours as  needed for nausea. TAKE 1 TABLET (25 MG TOTAL) BY MOUTH EVERY SIX (6) HOURS AS NEEDED FOR NAUSEA. 60 tablet 1  . scopolamine  (TRANSDERM-SCOP) 1 mg over 3 days Place 1 patch (1 mg total) on the skin every third day as needed for nausea or vomiting. 10 patch 12  . traZODone  (DESYREL ) 50 MG tablet Take 1 tablet by mouth nightly. (Patient taking differently: 1 tablet (50 mg total) by J-Tube route nightly.) 30 tablet 0   No current facility-administered medications for this visit.  [4] Family History Problem Relation Age of Onset  . Atrial fibrillation Mother   . Breast cancer Mother   . Clotting disorder Father        dvt due to vascular malformation and not clotting issues  . Kidney disease Father        stones  . Prostate cancer Father   . No Known Problems Brother   . Muscular dystrophy Maternal Grandmother   . Diabetes Maternal Grandfather   . Diabetes Paternal Grandmother   . No Known Problems Paternal Grandfather   . Breast cancer Paternal Cousin   [5] Social History Socioeconomic History  . Marital status: Single  Tobacco Use  . Smoking status: Never  . Smokeless tobacco: Never  Vaping Use  . Vaping status: Never Used  Substance and Sexual Activity  . Alcohol use: Not Currently  . Drug use: Not Currently    Types: Marijuana  . Sexual activity: Yes    Partners: Male    Birth control/protection: Condom    Comment: occasional condom   Social Drivers of Corporate investment banker Strain: Low Risk  (05/20/2024)   Overall Financial Resource Strain (CARDIA)   . Difficulty of Paying Living Expenses: Not hard at all  Recent Concern: Financial Resource Strain - Medium Risk (02/25/2024)   Received from [facility]   Overall Financial Resource Strain (CARDIA)   . Difficulty of Paying Living Expenses: Somewhat hard  Food Insecurity: No Food Insecurity (  05/20/2024)   Hunger Vital Sign   . Worried About Programme researcher, broadcasting/film/video in the Last Year: Never true   . Ran  Out of Food in the Last Year: Never true  Recent Concern: Food Insecurity - Food Insecurity Present (02/25/2024)   Received from [facility]   Hunger Vital Sign   . Within the past 12 months, you worried that your food would run out before you got the money to buy more.: Sometimes true   . Within the past 12 months, the food you bought just didn't last and you didn't have money to get more.: Sometimes true  Transportation Needs: No Transportation Needs (05/20/2024)   PRAPARE - Transportation   . Lack of Transportation (Medical): No   . Lack of Transportation (Non-Medical): No  Physical Activity: Unknown (11/11/2022)   Exercise Vital Sign   . Minutes of Exercise per Session: 0 min  Stress: Stress Concern Present (11/11/2022)   [employer] of Occupational Health - Occupational Stress Questionnaire   . Feeling of Stress : Rather much  Social Connections: Moderately Isolated (11/11/2022)   Social Connection and Isolation Panel   . Frequency of Communication with Friends and Family: More than three times a week   . Frequency of Social Gatherings with Friends and Family: More than three times a week   . Attends Religious Services: Never   . Active Member of Clubs or Organizations: No   . Attends Banker Meetings: Never   . Marital Status: Living with partner  Housing: Low Risk  (05/20/2024)   Housing   . Within the past 12 months, have you ever stayed: outside, in a car, in a tent, in an overnight shelter, or temporarily in someone else's home (i.e. couch-surfing)?: No   . Are you worried about losing your housing?: No  Recent Concern: Housing - High Risk (02/25/2024)   Received from [facility]   Housing Stability Vital Sign   . In the last 12 months, was there a time when you were not able to pay the mortgage or rent on time?: Yes   . In the past 12 months, how many times have you moved where you were living?: 1   . At any time in the  past 12 months, were you homeless or living in a shelter (including now)?: No  [6] Patient Active Problem List Diagnosis  . Nausea & vomiting  . Sinus bradycardia  . ARF (acute renal failure)  . Hydronephrosis with urinary obstruction due to ureteral calculus  . Recurrent UTI (urinary tract infection)  . Hx of Clostridium difficile infection  . Gastroesophageal reflux disease without esophagitis  . Orthostatic hypotension  . Primary insomnia  . S/P gastric bypass  . Prolonged QT interval  . Syncope  . Dehydration

## 2024-08-16 NOTE — Telephone Encounter (Signed)
 Pt called reporting that the pharmacy was not letting her fill her Oxycodone  sent in for her on the 14th of this month. This RN called the pharmacy leaving a message asking if they could provide their reason for not filling it and to fill it today if possible. This RN left the nurse line number for the pharmacy to call with any questions.

## 2024-08-16 NOTE — Telephone Encounter (Signed)
-------------------------------------------------------------------------------   Summary: Oxycodone  Refill -------------------------------------------------------------------------------  Pt called asking for her Oxycodone  to be released for her to pick up, I called the pharmacy to see why they were not filling it for her and they said because it is too early for a refill. Alex the pharmacist said July 29 she was prescribed 60 tablets which per the prescription of taking it twice a day should last her to the 28th so the earliest they could release the refill is the 26th. To be out of the prescription already she would be taking it more frequently the prescribed so the pharmacist said either patient needs to wait or the office can call on Dr Isabel behalf for a early refill or the prescription can be changed for a higher frequency to match how fast she's going through them. Dr Fredericka has been made aware of the options and this RN will wait for the providers instructions.

## 2024-08-26 ENCOUNTER — Other Ambulatory Visit: Payer: Self-pay

## 2024-08-26 ENCOUNTER — Encounter: Payer: Self-pay | Admitting: Emergency Medicine

## 2024-08-26 ENCOUNTER — Emergency Department
Admission: EM | Admit: 2024-08-26 | Discharge: 2024-08-26 | Disposition: A | Discharge status: Other Acute Inpatient Hospital | Attending: Emergency Medicine | Admitting: Emergency Medicine

## 2024-08-26 ENCOUNTER — Emergency Department

## 2024-08-26 DIAGNOSIS — K668 Other specified disorders of peritoneum: Secondary | ICD-10-CM | POA: Diagnosis not present

## 2024-08-26 DIAGNOSIS — K660 Peritoneal adhesions (postprocedural) (postinfection): Secondary | ICD-10-CM | POA: Diagnosis not present

## 2024-08-26 DIAGNOSIS — K9423 Gastrostomy malfunction: Secondary | ICD-10-CM | POA: Diagnosis not present

## 2024-08-26 DIAGNOSIS — K219 Gastro-esophageal reflux disease without esophagitis: Secondary | ICD-10-CM | POA: Diagnosis not present

## 2024-08-26 DIAGNOSIS — R609 Edema, unspecified: Secondary | ICD-10-CM | POA: Diagnosis not present

## 2024-08-26 DIAGNOSIS — R1012 Left upper quadrant pain: Secondary | ICD-10-CM | POA: Insufficient documentation

## 2024-08-26 DIAGNOSIS — R1900 Intra-abdominal and pelvic swelling, mass and lump, unspecified site: Secondary | ICD-10-CM | POA: Diagnosis not present

## 2024-08-26 DIAGNOSIS — R109 Unspecified abdominal pain: Secondary | ICD-10-CM | POA: Diagnosis not present

## 2024-08-26 DIAGNOSIS — Z743 Need for continuous supervision: Secondary | ICD-10-CM | POA: Diagnosis not present

## 2024-08-26 DIAGNOSIS — K9429 Other complications of gastrostomy: Secondary | ICD-10-CM | POA: Diagnosis not present

## 2024-08-26 DIAGNOSIS — R110 Nausea: Secondary | ICD-10-CM | POA: Diagnosis not present

## 2024-08-26 DIAGNOSIS — R1084 Generalized abdominal pain: Secondary | ICD-10-CM | POA: Diagnosis not present

## 2024-08-26 DIAGNOSIS — K561 Intussusception: Secondary | ICD-10-CM | POA: Diagnosis not present

## 2024-08-26 LAB — COMPREHENSIVE METABOLIC PANEL WITH GFR
ALT: 15 U/L (ref 0–44)
AST: 17 U/L (ref 15–41)
Albumin: 3.9 g/dL (ref 3.5–5.0)
Alkaline Phosphatase: 64 U/L (ref 38–126)
Anion gap: 9 (ref 5–15)
BUN: 12 mg/dL (ref 6–20)
CO2: 25 mmol/L (ref 22–32)
Calcium: 9.2 mg/dL (ref 8.9–10.3)
Chloride: 105 mmol/L (ref 98–111)
Creatinine, Ser: 0.48 mg/dL (ref 0.44–1.00)
GFR, Estimated: >60 mL/min (ref >60)
Glucose, Bld: 86 mg/dL (ref 70–99)
Potassium: 3.8 mmol/L (ref 3.5–5.1)
Sodium: 139 mmol/L (ref 135–145)
Total Bilirubin: 0.5 mg/dL (ref 0.0–1.2)
Total Protein: 6.9 g/dL (ref 6.5–8.1)

## 2024-08-26 LAB — CBC
HCT: 43.7 % (ref 36.0–46.0)
Hemoglobin: 14.6 g/dL (ref 12.0–15.0)
MCH: 32.7 pg (ref 26.0–34.0)
MCHC: 33.4 g/dL (ref 30.0–36.0)
MCV: 97.8 fL (ref 80.0–100.0)
Platelets: 282 K/uL (ref 150–400)
RBC: 4.47 MIL/uL (ref 3.87–5.11)
RDW: 12.2 % (ref 11.5–15.5)
WBC: 7.9 K/uL (ref 4.0–10.5)
nRBC: 0 % (ref 0.0–0.2)

## 2024-08-26 LAB — POC URINE PREG, ED: Preg Test, Ur: NEGATIVE

## 2024-08-26 LAB — LIPASE, BLOOD: Lipase: 27 U/L (ref 11–51)

## 2024-08-26 MED ORDER — FENTANYL CITRATE PF 50 MCG/ML IJ SOSY
50.0000 ug | PREFILLED_SYRINGE | Freq: Once | INTRAMUSCULAR | Status: AC
  Administered 2024-08-26: 50 ug via INTRAVENOUS
  Filled 2024-08-26: qty 1

## 2024-08-26 MED ORDER — HYDROMORPHONE HCL 1 MG/ML IJ SOLN
0.5000 mg | Freq: Once | INTRAMUSCULAR | Status: AC
  Administered 2024-08-26: 0.5 mg via INTRAVENOUS
  Filled 2024-08-26: qty 0.5

## 2024-08-26 MED ORDER — SODIUM CHLORIDE 0.9 % IV SOLN
12.5000 mg | Freq: Once | INTRAVENOUS | Status: AC
  Administered 2024-08-26: 12.5 mg via INTRAVENOUS
  Filled 2024-08-26: qty 12.5

## 2024-08-26 MED ORDER — IOHEXOL 300 MG/ML  SOLN
100.0000 mL | Freq: Once | INTRAMUSCULAR | Status: AC | PRN
  Administered 2024-08-26: 100 mL via INTRAVENOUS

## 2024-08-26 MED ORDER — ONDANSETRON HCL 4 MG/2ML IJ SOLN
4.0000 mg | Freq: Once | INTRAMUSCULAR | Status: AC
  Administered 2024-08-26: 4 mg via INTRAVENOUS
  Filled 2024-08-26: qty 2

## 2024-08-26 NOTE — ED Triage Notes (Signed)
 PT has g tube and noticed swelling around insertion site 3 days ago. Pt g tube was replaced in may 2025. Pt complains of pain with flushing tube. Pt called surgeon and was advised to come to ER for CT scan. Pt denies any vomiting but has felt nauseated.

## 2024-08-26 NOTE — ED Notes (Signed)
  [facility] transfer center [phone] and spoke with Alli to request a transfer of a mutual patient and CT was power shared to [facility] and they were able to pull it over. Transfer call to Dr. Arlander from call center waiting for return call from surgeon.

## 2024-08-26 NOTE — ED Notes (Signed)
 Received a call from [facility]. The Room assignment is 7E when nurse calls report they will provide her the bed assignment. Accepting provider is Manuelita Seats. Call report to [phone]. [facility] will send transport after 7pm shift change and will call for report when they are on their way.

## 2024-08-26 NOTE — ED Provider Notes (Signed)
 [facility] Provider Note    Event Date/Time   First MD Initiated Contact with Patient 08/26/24 1241     (approximate)   History   Abdominal Pain   HPI  Victoria Andrews is a 28 y.o. female with a history of cyclic vomiting syndrome, gastric bypass surgery with G-tube who presents with swelling and pain around her G-tube, increased pain with G-tube medicines or feedings.  Called her surgeon who recommended come in for CT scan     Physical Exam   Triage Vital Signs: ED Triage Vitals  Encounter Vitals Group     BP 08/26/24 1233 102/73     Girls Systolic BP Percentile --      Girls Diastolic BP Percentile --      Boys Systolic BP Percentile --      Boys Diastolic BP Percentile --      Pulse Rate 08/26/24 1233 84     Resp 08/26/24 1233 18     Temp 08/26/24 1233 98.4 F (36.9 C)     Temp src --      SpO2 08/26/24 1233 98 %     Weight 08/26/24 1234 56.7 kg (125 lb)     Height 08/26/24 1234 1.651 m (5' 5)     Head Circumference --      Peak Flow --      Pain Score 08/26/24 1234 7     Pain Loc --      Pain Education --      Exclude from Growth Chart --     Most recent vital signs: Vitals:   08/26/24 1240 08/26/24 1740  BP:  117/75  Pulse:  (!) 55  Resp:  17  Temp:  98.4 F (36.9 C)  SpO2: 98% 98%     General: Awake, no distress.  CV:  Good peripheral perfusion.  Resp:  Normal effort.  Abd:  No distention.  Mild swelling around G-tube insertion site, question possible bruising also, patient reports G-tube is flushing appropriately but does cause her discomfort Other:     ED Results / Procedures / Treatments   Labs (all labs ordered are listed, but only abnormal results are displayed) Labs Reviewed  CBC  COMPREHENSIVE METABOLIC PANEL WITH GFR  LIPASE, BLOOD  POC URINE PREG, ED     EKG     RADIOLOGY     PROCEDURES:  Critical Care performed:   Procedures   MEDICATIONS ORDERED IN ED: Medications  promethazine   (PHENERGAN ) 12.5 mg in sodium chloride  0.9 % 50 mL IVPB (has no administration in time range)  fentaNYL  (SUBLIMAZE ) injection 50 mcg (50 mcg Intravenous Given 08/26/24 1529)  ondansetron  (ZOFRAN ) injection 4 mg (4 mg Intravenous Given 08/26/24 1529)  fentaNYL  (SUBLIMAZE ) injection 50 mcg (50 mcg Intravenous Given 08/26/24 1556)  iohexol  (OMNIPAQUE ) 300 MG/ML solution 100 mL (100 mLs Intravenous Contrast Given 08/26/24 1636)  fentaNYL  (SUBLIMAZE ) injection 50 mcg (50 mcg Intravenous Given 08/26/24 1741)  HYDROmorphone  (DILAUDID ) injection 0.5 mg (0.5 mg Intravenous Given 08/26/24 1839)     IMPRESSION / MDM / ASSESSMENT AND PLAN / ED COURSE  I reviewed the triage vital signs and the nursing notes. Patient's presentation is most consistent with acute presentation with potential threat to life or bodily function.  Differential includes G-tube malpositioning, peptic ulcer disease  Will obtain labs CT abdomen pelvis, will treat with IV fentanyl  for pain.  CT scan demonstrates malpositioned G-tube, consulted with Dr. Fredericka her bariatric surgeon, who studied the images and  recommended transfer to [facility]  Family agrees with this plan.  Additional doses of IV pain medication were given as well as IV nausea medication.      FINAL CLINICAL IMPRESSION(S) / ED DIAGNOSES   Final diagnoses:  Left upper quadrant abdominal pain     Rx / DC Orders   ED Discharge Orders     None        Note:  This document was prepared using Dragon voice recognition software and may include unintentional dictation errors.   Arlander [place], MD 08/26/24 [phone]

## 2024-08-26 NOTE — ED Notes (Signed)
 [facility] transfer center [phone] and spoke with Alli to request a transfer of a mutual patient and CT was power shared to [facility] and they were able to pull it over. Transfer call to Dr. Arlander from call center waiting for return call from surgeon.

## 2024-08-26 NOTE — ED Notes (Signed)
 Attempted IV access without success. Put in for IV consult.

## 2024-09-24 DIAGNOSIS — E440 Moderate protein-calorie malnutrition: Secondary | ICD-10-CM | POA: Diagnosis not present

## 2024-09-24 DIAGNOSIS — K9429 Other complications of gastrostomy: Secondary | ICD-10-CM | POA: Diagnosis not present

## 2024-09-24 DIAGNOSIS — F319 Bipolar disorder, unspecified: Secondary | ICD-10-CM | POA: Diagnosis not present

## 2024-09-24 DIAGNOSIS — N281 Cyst of kidney, acquired: Secondary | ICD-10-CM | POA: Diagnosis not present

## 2024-09-24 DIAGNOSIS — R1084 Generalized abdominal pain: Secondary | ICD-10-CM | POA: Diagnosis not present

## 2024-09-24 DIAGNOSIS — Z7901 Long term (current) use of anticoagulants: Secondary | ICD-10-CM | POA: Diagnosis not present

## 2024-09-24 DIAGNOSIS — F419 Anxiety disorder, unspecified: Secondary | ICD-10-CM | POA: Diagnosis not present

## 2024-09-24 DIAGNOSIS — I951 Orthostatic hypotension: Secondary | ICD-10-CM | POA: Diagnosis not present

## 2024-09-24 DIAGNOSIS — R21 Rash and other nonspecific skin eruption: Secondary | ICD-10-CM | POA: Diagnosis not present

## 2024-09-24 DIAGNOSIS — Z9884 Bariatric surgery status: Secondary | ICD-10-CM | POA: Diagnosis not present

## 2024-09-24 DIAGNOSIS — Z888 Allergy status to other drugs, medicaments and biological substances status: Secondary | ICD-10-CM | POA: Diagnosis not present

## 2024-09-24 DIAGNOSIS — Z9109 Other allergy status, other than to drugs and biological substances: Secondary | ICD-10-CM | POA: Diagnosis not present

## 2024-09-24 DIAGNOSIS — K638219 Small intestinal bacterial overgrowth, unspecified: Secondary | ICD-10-CM | POA: Diagnosis not present

## 2024-09-24 DIAGNOSIS — R633 Feeding difficulties, unspecified: Secondary | ICD-10-CM | POA: Diagnosis not present

## 2024-09-24 DIAGNOSIS — K9423 Gastrostomy malfunction: Secondary | ICD-10-CM | POA: Diagnosis not present

## 2024-09-24 DIAGNOSIS — G8929 Other chronic pain: Secondary | ICD-10-CM | POA: Diagnosis not present

## 2024-09-24 DIAGNOSIS — N179 Acute kidney failure, unspecified: Secondary | ICD-10-CM | POA: Diagnosis not present

## 2024-09-24 DIAGNOSIS — R109 Unspecified abdominal pain: Secondary | ICD-10-CM | POA: Diagnosis not present

## 2024-09-24 DIAGNOSIS — G4700 Insomnia, unspecified: Secondary | ICD-10-CM | POA: Diagnosis not present

## 2024-09-24 DIAGNOSIS — F418 Other specified anxiety disorders: Secondary | ICD-10-CM | POA: Diagnosis not present

## 2024-09-24 DIAGNOSIS — Z9049 Acquired absence of other specified parts of digestive tract: Secondary | ICD-10-CM | POA: Diagnosis not present

## 2024-09-24 DIAGNOSIS — Z1152 Encounter for screening for COVID-19: Secondary | ICD-10-CM | POA: Diagnosis not present

## 2024-09-24 DIAGNOSIS — K580 Irritable bowel syndrome with diarrhea: Secondary | ICD-10-CM | POA: Diagnosis not present

## 2024-09-24 DIAGNOSIS — K562 Volvulus: Secondary | ICD-10-CM | POA: Diagnosis not present

## 2024-09-24 DIAGNOSIS — Z79899 Other long term (current) drug therapy: Secondary | ICD-10-CM | POA: Diagnosis not present

## 2024-09-24 DIAGNOSIS — Z681 Body mass index (BMI) 19 or less, adult: Secondary | ICD-10-CM | POA: Diagnosis not present

## 2024-09-24 DIAGNOSIS — K219 Gastro-esophageal reflux disease without esophagitis: Secondary | ICD-10-CM | POA: Diagnosis not present

## 2024-09-24 DIAGNOSIS — Z931 Gastrostomy status: Secondary | ICD-10-CM | POA: Diagnosis not present

## 2024-09-24 DIAGNOSIS — K316 Fistula of stomach and duodenum: Secondary | ICD-10-CM | POA: Diagnosis not present

## 2024-09-24 DIAGNOSIS — N3289 Other specified disorders of bladder: Secondary | ICD-10-CM | POA: Diagnosis not present

## 2024-09-24 DIAGNOSIS — Z91048 Other nonmedicinal substance allergy status: Secondary | ICD-10-CM | POA: Diagnosis not present

## 2024-09-24 DIAGNOSIS — R112 Nausea with vomiting, unspecified: Secondary | ICD-10-CM | POA: Diagnosis not present

## 2024-09-24 DIAGNOSIS — K561 Intussusception: Secondary | ICD-10-CM | POA: Diagnosis not present

## 2024-10-01 ENCOUNTER — Telehealth: Payer: Self-pay

## 2024-10-01 NOTE — Transitions of Care (Post Inpatient/ED Visit) (Signed)
   10/01/2024  Name: Victoria Andrews MRN: [id number] DOB: January 26, 1996  Today's TOC FU Call Status: Today's TOC FU Call Status:: Unsuccessful Call (1st Attempt) Unsuccessful Call (1st Attempt) Date: 10/01/24  Attempted to reach the patient regarding the most recent Inpatient/ED visit.  Follow Up Plan: Additional outreach attempts will be made to reach the patient to complete the Transitions of Care (Post Inpatient/ED visit) call.   Arvin Seip RN, BSN, CCM [employer], Population Health Case Manager Phone: [phone]

## 2024-10-04 ENCOUNTER — Telehealth: Payer: Self-pay

## 2024-10-04 NOTE — Transitions of Care (Post Inpatient/ED Visit) (Signed)
   10/04/2024  Name: Twisha Coryell MRN: [id number] DOB: 1996/01/10  Today's TOC FU Call Status: Today's TOC FU Call Status:: Unsuccessful Call (2nd Attempt) Unsuccessful Call (2nd Attempt) Date: 10/04/24  Attempted to reach the patient regarding the most recent Inpatient/ED visit.  Follow Up Plan: Additional outreach attempts will be made to reach the patient to complete the Transitions of Care (Post Inpatient/ED visit) call.   Arvin Seip RN, BSN, CCM [employer], Population Health Case Manager Phone: [phone]

## 2024-10-05 ENCOUNTER — Telehealth: Payer: Self-pay

## 2024-10-05 NOTE — Transitions of Care (Post Inpatient/ED Visit) (Signed)
   10/05/2024  Name: Destane Tucholski MRN: [id number] DOB: 01/31/1996  Today's TOC FU Call Status: Today's TOC FU Call Status:: Unsuccessful Call (3rd Attempt) Unsuccessful Call (3rd Attempt) Date: 10/05/24  Attempted to reach the patient regarding the most recent Inpatient/ED visit.  Follow Up Plan: No further outreach attempts will be made at this time. We have been unable to contact the patient.  Khara Renaud J. Divina Neale RN, MSN [facility], [facility] RN Care Manager Direct Dial: [phone]  Fax: [phone] Website: [email or web address]

## 2024-10-18 ENCOUNTER — Ambulatory Visit (INDEPENDENT_AMBULATORY_CARE_PROVIDER_SITE_OTHER): Admitting: Family

## 2024-10-18 ENCOUNTER — Encounter: Payer: Self-pay | Admitting: Family

## 2024-10-18 VITALS — BP 100/68 | HR 78 | Temp 98.2°F | Ht 65.0 in | Wt 123.6 lb

## 2024-10-18 DIAGNOSIS — M26621 Arthralgia of right temporomandibular joint: Secondary | ICD-10-CM | POA: Diagnosis not present

## 2024-10-18 MED ORDER — TIZANIDINE HCL 4 MG PO TABS: 4.0000 mg | ORAL_TABLET | Freq: Every day | ORAL | 0 refills | Status: DC

## 2024-10-18 NOTE — Progress Notes (Signed)
 Established Patient Office Visit  Subjective:      CC:  Chief Complaint  Patient presents with   Jaw Pain   Neck Pain    Onset was 5 days ago, feels like she might have slept wrong.    HPI: Victoria Andrews is a 28 y.o. female presenting on 10/18/2024 for Jaw Pain and Neck Pain (Onset was 5 days ago, feels like she might have slept wrong.) .  Discussed the use of AI scribe software for clinical note transcription with the patient, who gave verbal consent to proceed.  History of Present Illness Victoria Andrews is a 28 year old female who presents with jaw pain and pressure.  She has been experiencing a 'weird, like pressure pain' in her jaw for about a week. The pain is severe, does not radiate, and is not tender to touch. It worsened after vomiting, which she has been experiencing for three years. She feels like she cannot bite down fully. No ear pain or sore throat, but her neck feels swollen and has been popping a lot. No restricted range of motion in her neck.  She has a history of vomiting for three years, which has been less frequent recently. She had a tooth extraction approximately two to three weeks ago and is concerned about the healing process. Her weight has fluctuated significantly due to her health issues. 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 Her weight dropped to 113 pounds but has recently increased to 123 pounds. She wants to reach a healthier weight of around 160 pounds.  She experiences muscle tension and knots, particularly in her neck and back. She has a history of anxiety and poor posture, which may exacerbate her symptoms. She has used gabapentin in the past for muscle flare-ups but has not tried other muscle relaxants.         Social history:  Relevant past medical, surgical, family and social history reviewed and updated as indicated. Interim medical  history since our last visit reviewed.  Allergies and medications reviewed and updated.  DATA REVIEWED: CHART IN EPIC     ROS: Negative unless specifically indicated above in HPI.    Current Outpatient Medications:    escitalopram  (LEXAPRO ) 10 MG tablet, Take 10 mg by mouth daily., Disp: , Rfl:    famotidine  (PEPCID ) 20 MG tablet, Take 20 mg by mouth daily as needed for heartburn or indigestion., Disp: , Rfl:    loperamide (IMODIUM) 2 MG capsule, Take 2 mg by mouth as needed for diarrhea or loose stools., Disp: , Rfl:    LORazepam  (ATIVAN ) 1 MG tablet, Take 1 mg by mouth 2 (two) times daily as needed., Disp: , Rfl:    ondansetron  (ZOFRAN -ODT) 8 MG disintegrating tablet, Take 8 mg by mouth every 8 (eight) hours as needed., Disp: , Rfl:    Oxycodone  HCl 10 MG TABS, Take 1 tablet by mouth 2 (two) times daily as needed., Disp: , Rfl:    prochlorperazine  (COMPAZINE ) 10 MG tablet, Take 1 tablet (10 mg total) by mouth every 6 (six) hours as needed for nausea or vomiting., Disp: 20 tablet, Rfl: 0   promethazine  (PHENERGAN ) 25 MG tablet, Take 1 tablet (25 mg total) by mouth every 4 (four) hours as needed for nausea or vomiting., Disp: 30 tablet, Rfl: 0   tiZANidine (ZANAFLEX) 4 MG tablet, Take 1 tablet (4 mg total) by mouth at bedtime., Disp: 20 tablet, Rfl: 0  traZODone  (DESYREL ) 50 MG tablet, Take 1 tablet by mouth at bedtime as needed., Disp: , Rfl:         Objective:    Wt Readings from Last 3 Encounters:  10/18/24 123 lb 9.6 oz (56.1 kg)  08/26/24 125 lb (56.7 kg)  08/04/23 136 lb (61.7 kg)        BP 100/68 (BP Location: Right Arm, Patient Position: Sitting, Cuff Size: Normal)   Pulse 78   Temp 98.2 F (36.8 C) (Temporal)   Ht 5' 5 (1.651 m)   Wt 123 lb 9.6 oz (56.1 kg)   LMP 09/20/2024 (Approximate)   SpO2 98%   BMI 20.57 kg/m   Physical Exam MEASUREMENTS: Weight- 123. NECK: Muscle knot and tightness in neck muscles.  Wt Readings from Last 3 Encounters:  10/18/24  123 lb 9.6 oz (56.1 kg)  08/26/24 125 lb (56.7 kg)  08/04/23 136 lb (61.7 kg)    Physical Exam Vitals reviewed.  Constitutional:      General: She is not in acute distress.    Appearance: Normal appearance. She is normal weight. She is not ill-appearing, toxic-appearing or diaphoretic.  HENT:     Head: Normocephalic.     Jaw: Tenderness and pain on movement present. No swelling.  Cardiovascular:     Rate and Rhythm: Normal rate.  Pulmonary:     Effort: Pulmonary effort is normal.  Musculoskeletal:        General: Normal range of motion.     Cervical back: Normal range of motion. Muscular tenderness present. No pain with movement or spinous process tenderness.  Neurological:     General: No focal deficit present.     Mental Status: She is alert and oriented to person, place, and time. Mental status is at baseline.  Psychiatric:        Mood and Affect: Mood normal.        Behavior: Behavior normal.        Thought Content: Thought content normal.        Judgment: Judgment normal.          Results   Assessment & Plan:   Assessment and Plan Assessment & Plan Jaw pain and temporomandibular joint dysfunction Jaw pain with pressure sensation, possibly related to clenching or grinding. No significant pain on palpation. Symptoms exacerbated by vomiting. Differential includes temporomandibular joint dysfunction. Muscle tension in the neck may contribute to jaw pain. - Prescribe tizanidine for muscle relaxation at night for a few nights - Advise use of heat application on jaw and neck - Recommend neck exercises and massage for muscle tension - Suggest use of lidocaine  patches for muscle tension - Encourage dental evaluation for potential bite block  Myofascial neck pain with muscle spasm Neck pain with popping and muscle tension, likely due to poor posture and muscle spasm. No significant restriction in range of motion. Muscle knots present, possibly contributing to jaw pain.  Tension may be exacerbated by anxiety and poor posture. - Prescribe tizanidine for muscle relaxation at night for a few nights - Advise use of heat application on neck - Recommend neck exercises and massage for muscle tension - Suggest use of lidocaine  patches for muscle tension  Chronic vomiting Chronic vomiting with some improvement. Possible contribution to jaw pain and dental erosion. Previous weight loss noted, with recent weight gain indicating improvement. Potential link to acid reflux and pressure from gastric sleeve surgery. - Advise dental evaluation for potential erosion due to vomiting  Status post  dental extraction Healing well post dental extraction with no signs of infection or drainage. - Advise to avoid baths until extraction site is fully healed  Malnutrition, mild to moderate Previous significant weight loss with recent weight gain from 113 lbs to 123 lbs. Improvement in nutritional status noted. Weight loss initially due to chronic vomiting and complications from gastric sleeve surgery.  Generalized anxiety disorder Anxiety contributing to muscle tension and poor posture. Current management includes Ativan  as needed. Stress and anxiety may exacerbate muscle tension and clenching. - Advise against concurrent use of Ativan  and muscle relaxers - Encourage posture improvement and stress management techniques        Return in about 4 months (around 02/18/2025) for f/u CPE.     Ginger Patrick, MSN, APRN, FNP-C [facility]

## 2024-11-08 DIAGNOSIS — F4312 Post-traumatic stress disorder, chronic: Secondary | ICD-10-CM | POA: Diagnosis not present

## 2025-01-04 DIAGNOSIS — M26621 Arthralgia of right temporomandibular joint: Secondary | ICD-10-CM

## 2025-01-06 ENCOUNTER — Telehealth: Payer: Self-pay | Admitting: Family

## 2025-01-06 NOTE — Telephone Encounter (Signed)
 Copied from CRM [id number]. Topic: Clinical - Medication Question >> Jan 06, 2025 10:52 AM Revonda D wrote: Reason for CRM: Pt stated that she was advised by Dr. [name] to reach out to her PCP Ginger Patrick, FNP, and see if she is able to start prescribing LORazepam  (ATIVAN ) 1 MG tablet. Pt stated that Dr.Sharp is currently prescribing the medication but he is a specialist and stated that the PCP could prescribe the medication. Pt would like a callback with an update at [phone].

## 2025-01-06 NOTE — Telephone Encounter (Signed)
 Is pt being managed by psychiatry?  With the dx of bipolar history she would need to have any/all medications for anxiety managed with psychiatry so I would have her reach out to them to inquire.

## 2025-01-06 NOTE — Telephone Encounter (Signed)
 LM for pt to return my call.  Please provide message from provider/office when call is returned from patient.

## 2025-01-06 NOTE — Telephone Encounter (Signed)
 Patient returned call to CMA. I relayed message from provider.Patient stated that she would reach out to her psychiatrist,who's name is [place].

## 2025-01-22 ENCOUNTER — Other Ambulatory Visit: Payer: Self-pay | Admitting: Family

## 2025-01-22 DIAGNOSIS — M26621 Arthralgia of right temporomandibular joint: Secondary | ICD-10-CM

## 2025-03-03 ENCOUNTER — Encounter: Admitting: Family
